# Patient Record
Sex: Female | Born: 1990 | Race: Black or African American | Hispanic: No | Marital: Single | Smoking: Never smoker
Health system: Southern US, Community
[De-identification: ages and names within clinical notes are randomized; demographics above are authoritative.]

## PROBLEM LIST (undated history)

## (undated) DIAGNOSIS — K219 Gastro-esophageal reflux disease without esophagitis: Secondary | ICD-10-CM

## (undated) DIAGNOSIS — H9313 Tinnitus, bilateral: Secondary | ICD-10-CM

## (undated) DIAGNOSIS — H53149 Visual discomfort, unspecified: Secondary | ICD-10-CM

## (undated) DIAGNOSIS — D649 Anemia, unspecified: Secondary | ICD-10-CM

## (undated) DIAGNOSIS — Z9889 Other specified postprocedural states: Secondary | ICD-10-CM

## (undated) DIAGNOSIS — D219 Benign neoplasm of connective and other soft tissue, unspecified: Secondary | ICD-10-CM

## (undated) DIAGNOSIS — T8579XA Infection and inflammatory reaction due to other internal prosthetic devices, implants and grafts, initial encounter: Secondary | ICD-10-CM

## (undated) DIAGNOSIS — R112 Nausea with vomiting, unspecified: Secondary | ICD-10-CM

## (undated) DIAGNOSIS — N83201 Unspecified ovarian cyst, right side: Secondary | ICD-10-CM

## (undated) DIAGNOSIS — J45909 Unspecified asthma, uncomplicated: Secondary | ICD-10-CM

## (undated) DIAGNOSIS — G43909 Migraine, unspecified, not intractable, without status migrainosus: Secondary | ICD-10-CM

## (undated) DIAGNOSIS — R519 Headache, unspecified: Secondary | ICD-10-CM

## (undated) HISTORY — PX: COLONOSCOPY: SHX174

## (undated) HISTORY — PX: INSERTION OF MESH: SHX5868

## (undated) HISTORY — PX: HERNIA REPAIR: SHX51

---

## 2016-04-03 HISTORY — PX: INSERTION OF MESH: SHX5868

## 2016-04-03 HISTORY — PX: HERNIA REPAIR: SHX51

## 2019-04-14 DIAGNOSIS — G894 Chronic pain syndrome: Secondary | ICD-10-CM | POA: Insufficient documentation

## 2019-04-14 DIAGNOSIS — M79604 Pain in right leg: Secondary | ICD-10-CM | POA: Insufficient documentation

## 2019-04-14 DIAGNOSIS — M25531 Pain in right wrist: Secondary | ICD-10-CM | POA: Insufficient documentation

## 2019-04-14 DIAGNOSIS — G8929 Other chronic pain: Secondary | ICD-10-CM | POA: Insufficient documentation

## 2019-07-10 ENCOUNTER — Other Ambulatory Visit: Payer: Self-pay | Admitting: Physician Assistant

## 2019-07-10 DIAGNOSIS — N644 Mastodynia: Secondary | ICD-10-CM

## 2019-07-10 DIAGNOSIS — R600 Localized edema: Secondary | ICD-10-CM | POA: Insufficient documentation

## 2019-07-10 DIAGNOSIS — E66813 Obesity, class 3: Secondary | ICD-10-CM | POA: Insufficient documentation

## 2019-07-10 DIAGNOSIS — K219 Gastro-esophageal reflux disease without esophagitis: Secondary | ICD-10-CM | POA: Insufficient documentation

## 2019-07-10 DIAGNOSIS — N649 Disorder of breast, unspecified: Secondary | ICD-10-CM

## 2019-07-10 DIAGNOSIS — J4520 Mild intermittent asthma, uncomplicated: Secondary | ICD-10-CM | POA: Insufficient documentation

## 2019-07-15 ENCOUNTER — Other Ambulatory Visit: Payer: Self-pay | Admitting: Physician Assistant

## 2019-07-15 DIAGNOSIS — N649 Disorder of breast, unspecified: Secondary | ICD-10-CM

## 2019-07-15 DIAGNOSIS — N644 Mastodynia: Secondary | ICD-10-CM

## 2019-07-16 ENCOUNTER — Emergency Department
Admission: EM | Admit: 2019-07-16 | Discharge: 2019-07-16 | Disposition: A | Discharge status: Home / Self Care | Payer: BC Managed Care – PPO | Attending: Emergency Medicine | Admitting: Emergency Medicine

## 2019-07-16 ENCOUNTER — Other Ambulatory Visit: Payer: Self-pay

## 2019-07-16 ENCOUNTER — Emergency Department: Payer: BC Managed Care – PPO

## 2019-07-16 ENCOUNTER — Encounter: Payer: Self-pay | Admitting: Emergency Medicine

## 2019-07-16 DIAGNOSIS — J45909 Unspecified asthma, uncomplicated: Secondary | ICD-10-CM | POA: Insufficient documentation

## 2019-07-16 DIAGNOSIS — K529 Noninfective gastroenteritis and colitis, unspecified: Secondary | ICD-10-CM | POA: Insufficient documentation

## 2019-07-16 DIAGNOSIS — R109 Unspecified abdominal pain: Secondary | ICD-10-CM | POA: Diagnosis present

## 2019-07-16 HISTORY — DX: Unspecified asthma, uncomplicated: J45.909

## 2019-07-16 HISTORY — DX: Benign neoplasm of connective and other soft tissue, unspecified: D21.9

## 2019-07-16 HISTORY — DX: Infection and inflammatory reaction due to other internal prosthetic devices, implants and grafts, initial encounter: T85.79XA

## 2019-07-16 LAB — URINALYSIS, COMPLETE (UACMP) WITH MICROSCOPIC
Bacteria, UA: NONE SEEN
Bilirubin Urine: NEGATIVE
Glucose, UA: NEGATIVE mg/dL
Ketones, ur: NEGATIVE mg/dL
Nitrite: NEGATIVE
Protein, ur: 30 mg/dL — AB
Specific Gravity, Urine: 1.014 (ref 1.005–1.030)
WBC, UA: >50 WBC/hpf — ABNORMAL HIGH (ref 0–5)
pH: 7 (ref 5.0–8.0)

## 2019-07-16 LAB — CBC
HCT: 40.7 % (ref 36.0–46.0)
Hemoglobin: 13.8 g/dL (ref 12.0–15.0)
MCH: 29.2 pg (ref 26.0–34.0)
MCHC: 33.9 g/dL (ref 30.0–36.0)
MCV: 86 fL (ref 80.0–100.0)
Platelets: 268 10*3/uL (ref 150–400)
RBC: 4.73 MIL/uL (ref 3.87–5.11)
RDW: 13.1 % (ref 11.5–15.5)
WBC: 5.6 10*3/uL (ref 4.0–10.5)
nRBC: 0 % (ref 0.0–0.2)

## 2019-07-16 LAB — COMPREHENSIVE METABOLIC PANEL
ALT: 23 U/L (ref 0–44)
AST: 19 U/L (ref 15–41)
Albumin: 4.6 g/dL (ref 3.5–5.0)
Alkaline Phosphatase: 61 U/L (ref 38–126)
Anion gap: 9 (ref 5–15)
BUN: 12 mg/dL (ref 6–20)
CO2: 27 mmol/L (ref 22–32)
Calcium: 9 mg/dL (ref 8.9–10.3)
Chloride: 101 mmol/L (ref 98–111)
Creatinine, Ser: 0.85 mg/dL (ref 0.44–1.00)
GFR calc Af Amer: >60 mL/min (ref >60)
GFR calc non Af Amer: >60 mL/min (ref >60)
Glucose, Bld: 105 mg/dL — ABNORMAL HIGH (ref 70–99)
Potassium: 3.9 mmol/L (ref 3.5–5.1)
Sodium: 137 mmol/L (ref 135–145)
Total Bilirubin: 0.7 mg/dL (ref 0.3–1.2)
Total Protein: 7.8 g/dL (ref 6.5–8.1)

## 2019-07-16 LAB — POCT PREGNANCY, URINE: Preg Test, Ur: NEGATIVE

## 2019-07-16 LAB — LIPASE, BLOOD: Lipase: 34 U/L (ref 11–51)

## 2019-07-16 MED ORDER — METRONIDAZOLE 500 MG PO TABS: 500.0000 mg | ORAL_TABLET | Freq: Three times a day (TID) | ORAL | 0 refills | Status: AC

## 2019-07-16 MED ORDER — IOHEXOL 300 MG/ML  SOLN
100.0000 mL | Freq: Once | INTRAMUSCULAR | Status: AC | PRN
  Administered 2019-07-16: 100 mL via INTRAVENOUS
  Filled 2019-07-16: qty 100

## 2019-07-16 MED ORDER — AZITHROMYCIN 500 MG PO TABS: 500.0000 mg | ORAL_TABLET | Freq: Every day | ORAL | 0 refills | Status: AC

## 2019-07-16 NOTE — Discharge Instructions (Signed)
Take azithromycin daily for 3 days. Take Flagyl 3 times a day for the next week.

## 2019-07-16 NOTE — ED Provider Notes (Signed)
Emergency Department Provider Note  ____________________________________________  Time seen: Approximately 7:11 PM  I have reviewed the triage vital signs and the nursing notes.   HISTORY  Chief Complaint Abdominal Pain   Historian Patient     HPI Kristina Lane is a 29 y.o. female with a history of prior hernia repairs, presents to the emergency department with sharp and stabbing generalized abdominal pain and perceived abdominal distension.  Patient states that she has had nausea, vomiting and diarrhea intermittently over the past month and has a CT abdomen scheduled with general surgery.  Patient is concerned for compromise of her hernia repair.  She has not noticed any erythema of the skin overlying abdomen.  No dysuria, hematuria or increased urinary frequency.  She does state that she recently ended her menstrual cycle.   Past Medical History:  Diagnosis Date  . Asthma   . Fibroids   . 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     Past Medical History:  Diagnosis Date  . Asthma   . Fibroids   . 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   Past Surgical History:  Procedure Laterality Date  . INSERTION OF MESH      Prior to Admission medications   Medication Sig Start Date End Date Taking? Authorizing Provider  azithromycin (ZITHROMAX) 500 MG tablet Take 1 tablet (500 mg total) by mouth daily for 3 days. 07/16/19 07/19/19  Lannie Fields, PA-C  metroNIDAZOLE (FLAGYL) 500 MG tablet Take 1 tablet (500 mg total) by mouth 3 (three) times daily for 7 days. 07/16/19 07/23/19  Lannie Fields, PA-C    Allergies Ciprofloxacin and Penicillins  No family history on file.  Social History Social History   Tobacco Use  . Smoking status: Not on file  Substance Use Topics  . Alcohol use: Not on file  . Drug use: Not on file     Review of Systems   Constitutional: No fever/chills Eyes:  No discharge ENT: No upper respiratory complaints. Respiratory: no cough. No SOB/ use of accessory muscles to breath Gastrointestinal: Patient has abdominal pain.  Musculoskeletal: Negative for musculoskeletal pain. Skin: Negative for rash, abrasions, lacerations, ecchymosis.    ____________________________________________   PHYSICAL EXAM:  VITAL SIGNS: ED Triage Vitals  Enc Vitals Group     BP 07/16/19 1701 (!) 188/123     Pulse Rate 07/16/19 1701 98     Resp 07/16/19 1701 20     Temp 07/16/19 1701 99 F (37.2 C)     Temp Source 07/16/19 1701 Oral     SpO2 07/16/19 1701 99 %     Weight 07/16/19 1658 222 lb (100.7 kg)     Height 07/16/19 1658 5' (1.524 m)     Head Circumference --      Peak Flow --      Pain Score 07/16/19 1658 9     Pain Loc --      Pain Edu? --      Excl. in [place]? --      Constitutional: Alert and oriented. Well appearing and in no acute distress. Eyes: Conjunctivae are normal. PERRL. EOMI. Head: Atraumatic. ENT:      Ears: TMs are pearly.      Nose: No congestion/rhinnorhea.      Mouth/Throat: Mucous membranes are moist.  Neck: No stridor.  No cervical spine  tenderness to palpation. Hematological/Lymphatic/Immunilogical: No cervical lymphadenopathy. Cardiovascular: Normal rate, regular rhythm. Normal S1 and S2.  Good peripheral circulation. Respiratory: Normal respiratory effort without tachypnea or retractions. Lungs CTAB. Good air entry to the bases with no decreased or absent breath sounds Gastrointestinal: Bowel sounds x 4 quadrants. Soft and nontender to palpation. No guarding or rigidity. No distention. Musculoskeletal: Full range of motion to all extremities. No obvious deformities noted Neurologic:  Normal for age. No gross focal neurologic deficits are appreciated.  Skin:  Skin is warm, dry and intact. No rash noted. Psychiatric: Mood and affect are normal for age. Speech and behavior are normal.    ____________________________________________   LABS (all labs ordered are listed, but only abnormal results are displayed)  Labs Reviewed  COMPREHENSIVE METABOLIC PANEL - Abnormal; Notable for the following components:      Result Value   Glucose, Bld 105 (*)    All other components within normal limits  URINALYSIS, COMPLETE (UACMP) WITH MICROSCOPIC - Abnormal; Notable for the following components:   Color, Urine YELLOW (*)    APPearance CLOUDY (*)    Hgb urine dipstick LARGE (*)    Protein, ur 30 (*)    Leukocytes,Ua MODERATE (*)    WBC, UA >50 (*)    All other components within normal limits  LIPASE, BLOOD  CBC  POCT PREGNANCY, URINE   ____________________________________________  EKG   ____________________________________________  RADIOLOGYI, Lannie Fields, personally viewed and evaluated these images (plain radiographs) as part of my medical decision making, as well as reviewing the written report by the radiologist.    CT ABDOMEN PELVIS W CONTRAST  Result Date: 07/16/2019 CLINICAL DATA:  Abdominal distension, severe lower abdominal pain. History of uterine fibroids and lower abdominal mesh hernia repair EXAM: CT ABDOMEN AND PELVIS WITH CONTRAST TECHNIQUE: Multidetector CT imaging of the abdomen and pelvis was performed using the standard protocol following bolus administration of intravenous contrast. CONTRAST:  120mL OMNIPAQUE IOHEXOL 300 MG/ML  SOLN COMPARISON:  None FINDINGS: Lower chest: Lung bases are clear. Normal heart size. No pericardial effusion. Hepatobiliary: No focal liver abnormality is seen. No gallstones, gallbladder wall thickening, or biliary dilatation. Pancreas: Unremarkable. No pancreatic ductal dilatation or surrounding inflammatory changes. Spleen: Normal in size without focal abnormality. Adrenals/Urinary Tract: Adrenal glands are unremarkable. Kidneys are normal, without renal calculi, focal lesion, or hydronephrosis. Urinary bladder is largely  decompressed at the time of exam and therefore poorly evaluated by CT imaging. Stomach/Bowel: Moderate hiatal hernia. Distal stomach and duodenum are unremarkable. No small bowel dilatation or wall thickening. A normal appendix is visualized. There is some mild edematous mural thickening of the descending colon and proximal sigmoid. No extraluminal gas or fluid. No evidence of bowel obstruction. Vascular/Lymphatic: The aorta is normal caliber. No suspicious or enlarged lymph nodes in the included lymphatic chains. Reproductive: Anteverted uterus with a lobular contour along the dorsal aspect likely reflecting the reportedly known uterine fibroid. No concerning adnexal lesions. Other: No abdominopelvic free fluid or air. No bowel containing hernias. Evidence of prior low vertical midline incision with anterior mesh hernia repair without visible acute complication at this time. Musculoskeletal: Some mild atrophy of the rectus sheath about the level of the incisional changes, may be postsurgical in nature. No acute osseous abnormality or suspicious osseous lesion. IMPRESSION: 1. Mild edematous mural thickening of the descending colon and proximal sigmoid colon may represent a mild colitis of infectious or inflammatory etiology. 2. Moderate hiatal hernia. 3. Evidence of prior low vertical midline  incision with anterior mesh hernia repair without visible acute complication at this time. 4. Fibroid uterus. Electronically Signed   By: Lovena Le M.D.   On: 07/16/2019 19:50    ____________________________________________    PROCEDURES  Procedure(s) performed:     Procedures     Medications  iohexol (OMNIPAQUE) 300 MG/ML solution 100 mL (100 mLs Intravenous Contrast Given 07/16/19 1930)     ____________________________________________   INITIAL IMPRESSION / ASSESSMENT AND PLAN / ED COURSE  Pertinent labs & imaging results that were available during my care of the patient were reviewed by me and  considered in my medical decision making (see chart for details).  Clinical Course as of Jul 15 2236  Wed Jul 16, 2019  1843 Hemoglobin: 13.8 [JW]    Clinical Course User Index [JW] Lannie Fields, PA-C   Assessment and plan Abdominal pain 29 year old female presents to the emergency department with generalized abdominal pain along with nausea, vomiting and mild diarrhea that has occurred over the past month.  Vital signs were reviewed at triage.  Patient was hypertensive but vital signs were otherwise reassuring.  On physical exam, abdomen was soft and nontender.  Differential diagnosis included compromised hernia repair, intra-abdominal abscess, colitis.  CT abdomen and pelvis revealed a moderate sized hiatal hernia but no other compromise in anterior mesh hernia repair.  CT findings did suggest inflammatory or infectious findings of colitis.  We will treat patient with azithromycin given anaphylaxis with penicillins and Cipro.  Patient was also discharged with Flagyl.  She was advised to follow-up with primary care as needed.  A work note was provided at her request.    ____________________________________________  FINAL CLINICAL IMPRESSION(S) / ED DIAGNOSES  Final diagnoses:  Colitis      NEW MEDICATIONS STARTED DURING THIS VISIT:  ED Discharge Orders         Ordered    azithromycin (ZITHROMAX) 500 MG tablet  Daily     07/16/19 2018    metroNIDAZOLE (FLAGYL) 500 MG tablet  3 times daily     07/16/19 2018              This chart was dictated using voice recognition software/Dragon. Despite best efforts to proofread, errors can occur which can change the meaning. Any change was purely unintentional.     Lannie Fields, PA-C 07/16/19 2247    Harvest Dark, MD 07/16/19 2257

## 2019-07-16 NOTE — ED Triage Notes (Signed)
Pt reports severe abd pain to her lower and upper abd. Pt reports has seen her MD for it and has  CT scan scheduled for it because she already has fibroids and a mesh but the pain is to severe so she was advised to come for evaluation. Pt describes the pain as stabbing and cramping and consistent in nature.

## 2019-07-16 NOTE — ED Triage Notes (Signed)
FIRST NURSE NOTE- ambulatory NAD

## 2019-07-24 ENCOUNTER — Ambulatory Visit
Admission: RE | Admit: 2019-07-24 | Discharge: 2019-07-24 | Disposition: A | Discharge status: Home / Self Care | Payer: BC Managed Care – PPO | Source: Ambulatory Visit | Attending: Physician Assistant | Admitting: Physician Assistant

## 2019-07-24 DIAGNOSIS — N649 Disorder of breast, unspecified: Secondary | ICD-10-CM | POA: Diagnosis present

## 2019-07-24 DIAGNOSIS — N644 Mastodynia: Secondary | ICD-10-CM

## 2019-08-15 ENCOUNTER — Other Ambulatory Visit: Payer: Self-pay | Admitting: Obstetrics and Gynecology

## 2019-09-09 NOTE — H&P (Signed)
Kristina Lane is a 29 y.o. female here for Rf Vicente Males Mackie,CNM-uterine fibroids and pelvic pain .pt sent to me by Drinda Butts for a 5 yr h/o of pelvic pain usually starting 3 days before cycle and into 1-2 days of menses . G2P2 one c/s followed by SVD  . Last 5 months with deep thrust dyspareunia . Same partner .   Menses are monthly and are not heavy  U/s recent :  Uterus anteverted  Endometrium=14.64mm  Fibroids seen:1)Lt lateral=1.7cm 2)mid=1.4cm 3)posterior=4.5cm  No free fluid seen  Lt ovary appears wnl  Rt simple ovarian cyst=1.8cm     Past Medical History:  has a past medical history of Asthma, unspecified asthma severity, unspecified whether complicated, unspecified whether persistent, GERD (gastroesophageal reflux disease) (07/10/2019), Localized edema (07/10/2019), Obesity, and Obesity, Class III, BMI 40-49.9 (morbid obesity) (CMS-HCC) (07/10/2019).  Past Surgical History:  has a past surgical history that includes Hernia repair and Cesarean section (2014). Family History: family history includes Arthritis in her paternal grandmother; Breast cancer in her maternal grandmother and paternal aunt; Kidney disease in her father. Social History:  reports that she has never smoked. She has never used smokeless tobacco. She reports that she does not drink alcohol and does not use drugs. OB/GYN History:          OB History    Gravida  2   Para  2   Term  1   Preterm  1   AB      Living  2     SAB      TAB      Ectopic      Molar      Multiple      Live Births  2          Allergies: is allergic to ciprofloxacin and penicillin. PCN allergy ( throat swelling ) Medications:  Current Outpatient Medications:  .  acetaminophen (TYLENOL) 500 mg capsule, Take by mouth, Disp: , Rfl:  .  butalbital-acetaminophen-caffeine (FIORICET) 50-325-40 mg capsule, Take 1-2 capsules by mouth every 6 (six) hours as needed for Headache Patient will take 2 tablets at the  onset of headache, then she may take 1 tablet q 6 hours , not to exceed 6 tablets in a 24 hour period, Disp: 30 capsule, Rfl: 0 .  ergocalciferol, vitamin D2, 1,250 mcg (50,000 unit) capsule, Take by mouth, Disp: , Rfl:  .  FUROsemide (LASIX) 20 MG tablet, Take 1 tablet (20 mg total) by mouth once daily as needed for Edema, Disp: 30 tablet, Rfl: 0 .  potassium chloride (KLOR-CON) 10 MEQ ER tablet, Take 1 tablet (10 mEq total) by mouth once daily (with each Lasix pill), Disp: 30 tablet, Rfl: 0 .  azithromycin (ZITHROMAX) 500 MG tablet, Take 500 mg by mouth once daily (Patient not taking: Reported on 08/07/2019  ), Disp: , Rfl:  .  FUROsemide (LASIX) 20 MG tablet, Take 1 tablet (20 mg total) by mouth once daily (Patient not taking: Reported on 08/07/2019  ), Disp: 30 tablet, Rfl: 0 .  potassium chloride (KLOR-CON) 10 MEQ ER tablet, Take 1 tablet (10 mEq total) by mouth once daily (take at the same time as Lasix) (Patient not taking: Reported on 08/07/2019  ), Disp: 30 tablet, Rfl: 0  Review of Systems: General:                      No fatigue or weight loss Eyes:  No vision changes Ears:                            No hearing difficulty Respiratory:                No cough or shortness of breath Pulmonary:                  No asthma or shortness of breath Cardiovascular:           No chest pain, palpitations, dyspnea on exertion Gastrointestinal:          No abdominal bloating, chronic diarrhea, constipations, masses, pain or hematochezia Genitourinary:             No hematuria, dysuria, abnormal vaginal discharge, +pelvic pain, Menometrorrhagia, + dyspareunia Lymphatic:                   No swollen lymph nodes Musculoskeletal:         No muscle weakness Neurologic:                  No extremity weakness, syncope, seizure disorder Psychiatric:                  No history of depression, delusions or suicidal/homicidal ideation    Exam:      Vitals:   09/10/2019 1607   BP: (!) 138/98  Pulse:        Body mass index is 42.51 kg/m.  WDWN  black female in NAD   Lungs: CTA  CV : RRR without murmur   Neck:  no thyromegaly Abdomen: soft , no mass, normal active bowel sounds,  non-tender, no rebound tenderness Pelvic: tanner stage 5 ,  External genitalia: vulva /labia no lesions Urethra: no prolapse Vagina: bloody d/c  Adequate for LAVH if need be  Cervix: no lesions, no cervical motion tenderness   Uterus: normal size shape and contour, non-tender Adnexa: no mass,  non-tender   Rectovaginal: no mass heme negative  Impression:   The primary encounter diagnosis was Pelvic pain in female. Diagnoses of Dyspareunia, female and Intramural leiomyoma of uterus were also pertinent to this visit. History of pain is c/w endometriosis   Plan:   Long discussion regarding options for treating pain :  continuous OCPS( she didn't tolerate ocp at age 30-12 yo) Empiric tx with depolupron orOrlissa  Surgical intervention with LAVH and bilateral salpingectomy  Pro and cons discusses for each . She has elected to  Proceed with surgical intervention       Caroline Sauger, MD

## 2019-09-10 ENCOUNTER — Other Ambulatory Visit: Payer: Self-pay | Admitting: Obstetrics and Gynecology

## 2019-09-11 ENCOUNTER — Encounter
Admission: RE | Admit: 2019-09-11 | Discharge: 2019-09-11 | Disposition: A | Discharge status: Home / Self Care | Payer: BC Managed Care – PPO | Source: Ambulatory Visit | Attending: Obstetrics and Gynecology | Admitting: Obstetrics and Gynecology

## 2019-09-11 ENCOUNTER — Other Ambulatory Visit: Payer: Self-pay

## 2019-09-11 HISTORY — DX: Gastro-esophageal reflux disease without esophagitis: K21.9

## 2019-09-11 HISTORY — DX: Headache, unspecified: R51.9

## 2019-09-11 NOTE — Patient Instructions (Signed)
COVID TESTING Date: September 18, 2019 Testing site:  [facility] Hours:  3:53 am - 1:00 pm Once you are tested, you are asked to stay quarantined (avoiding public places) until after your surgery.   Your procedure is scheduled on: September 22, 2019 MONDAY Report to Day Surgery on the 2nd floor of the [facility]. To find out your arrival time, please call [phone] between 1PM - 3PM on: Friday September 19, 2019  REMEMBER: Instructions that are not followed completely may result in serious medical risk, up to and including death; or upon the discretion of your surgeon and anesthesiologist your surgery may need to be rescheduled.  Do not eat food after midnight the night before surgery.  No gum chewing, lozengers or hard candies.  You may however, drink CLEAR liquids up to 2 hours before you are scheduled to arrive for your surgery. Do not drink anything within 2 hours of your scheduled arrival time.  Clear liquids include: - water  - apple juice without pulp - gatorade (not RED) - black coffee or tea (Do NOT add milk or creamers to the coffee or tea) Do NOT drink anything that is not on this list.  Type 1 and Type 2 diabetics should only drink water.  ENSURE PRE-SURGERY CARBOHYDRATE DRINK:  Complete drinking 2 hours prior to scheduled arrival time.  TAKE THESE MEDICATIONS THE MORNING OF SURGERY WITH A SIP OF WATER: NONE  Stop Anti-inflammatories (NSAIDS) such as Advil, Aleve, Ibuprofen, Motrin, Naproxen, Naprosyn and ASPIRIN OR Aspirin based products such as Excedrin, Goodys Powder, BC Powder. (May take Tylenol or Acetaminophen if needed.)  Stop ANY OVER THE COUNTER supplements until after surgery. (May continue Vitamin D, Vitamin B, and multivitamin.)  No Alcohol for 24 hours before or after surgery.  No Smoking including e-cigarettes for 24 hours prior to surgery.  No chewable tobacco products for at least 6 hours prior to surgery.   No nicotine patches on the day of surgery.  Do not use any "recreational" drugs for at least a week prior to your surgery.  Please be advised that the combination of cocaine and anesthesia may have negative outcomes, up to and including death. If you test positive for cocaine, your surgery will be cancelled.  On the morning of surgery brush your teeth with toothpaste and water, you may rinse your mouth with mouthwash if you wish. Do not swallow any toothpaste or mouthwash.  Do not wear jewelry, make-up, hairpins, clips or nail polish.  Do not wear lotions, powders, or perfumes.   Do not shave 48 hours prior to surgery.   Contact lenses, hearing aids and dentures may not be worn into surgery.  Do not bring valuables to the hospital. [facility] is not responsible for any missing/lost belongings or valuables.   Use CHG Soap as directed on instruction sheet.  Notify your doctor if there is any change in your medical condition (cold, fever, infection).  Wear comfortable clothing (specific to your surgery type) to the hospital.  Plan for stool softeners for home use; pain medications have a tendency to cause constipation. You can also help prevent constipation by eating foods high in fiber such as fruits and vegetables and drinking plenty of fluids as your diet allows.  After surgery, you can help prevent lung complications by doing breathing exercises.  Take deep breaths and cough every 1-2 hours. Your doctor may order a device called an Incentive Spirometer to help you  take deep breaths. When coughing or sneezing, hold a pillow firmly against your incision with both hands. This is called "splinting." Doing this helps protect your incision. It also decreases belly discomfort.  If you are being admitted to the hospital overnight, leave your suitcase in the car. After surgery it may be brought to your room.  If you are being discharged the day of surgery, you will not be allowed to  drive home. You will need a responsible adult (18 years or older) to drive you home and stay with you that night.   If you are taking public transportation, you will need to have a responsible adult (18 years or older) with you. Please confirm with your physician that it is acceptable to use public transportation.   Please call the [facility] at [phone] if you have any questions about these instructions.  Visitation Policy:  Patients undergoing a surgery or procedure may have one family member or support person with them as long as that person is not COVID-19 positive or experiencing its symptoms.  That person may remain in the waiting area during the procedure.  Children under 22 years of age may have both parents or legal guardians with them during their procedure.  Inpatient Visitation Update:   Two designated support people may visit a patient during visiting hours 7 am to 8 pm. It must be the same two designated people for the duration of the patient stay. The visitors may come and go during the day, and there is no switching out to have different visitors. A mask must be worn at all times, including in the patient room.  Children under 86 years of age:  a total of 4 designated visitors for the child's entire stay are allowed. Only 2 in the room at a time and only one staying overnight at a time. The overnight guest can now rotate during the child's hospital stay.  As a reminder, masks are still required for all [place] team members, patients and visitors in all [facility] facilities.   Systemwide, no visitors 17 or younger.

## 2019-09-18 ENCOUNTER — Other Ambulatory Visit
Admission: RE | Admit: 2019-09-18 | Discharge: 2019-09-18 | Disposition: A | Discharge status: Home / Self Care | Payer: BC Managed Care – PPO | Source: Ambulatory Visit | Attending: Obstetrics and Gynecology | Admitting: Obstetrics and Gynecology

## 2019-09-18 ENCOUNTER — Other Ambulatory Visit: Payer: Self-pay

## 2019-09-18 DIAGNOSIS — Z20822 Contact with and (suspected) exposure to covid-19: Secondary | ICD-10-CM | POA: Diagnosis not present

## 2019-09-18 DIAGNOSIS — Z01812 Encounter for preprocedural laboratory examination: Secondary | ICD-10-CM | POA: Insufficient documentation

## 2019-09-18 LAB — SARS CORONAVIRUS 2 (TAT 6-24 HRS): SARS Coronavirus 2: NEGATIVE

## 2019-09-18 LAB — BASIC METABOLIC PANEL
Anion gap: 8 (ref 5–15)
BUN: 14 mg/dL (ref 6–20)
CO2: 24 mmol/L (ref 22–32)
Calcium: 8.7 mg/dL — ABNORMAL LOW (ref 8.9–10.3)
Chloride: 106 mmol/L (ref 98–111)
Creatinine, Ser: 0.74 mg/dL (ref 0.44–1.00)
GFR calc Af Amer: >60 mL/min (ref >60)
GFR calc non Af Amer: >60 mL/min (ref >60)
Glucose, Bld: 93 mg/dL (ref 70–99)
Potassium: 3.6 mmol/L (ref 3.5–5.1)
Sodium: 138 mmol/L (ref 135–145)

## 2019-09-18 LAB — CBC
HCT: 37.7 % (ref 36.0–46.0)
Hemoglobin: 13.1 g/dL (ref 12.0–15.0)
MCH: 29 pg (ref 26.0–34.0)
MCHC: 34.7 g/dL (ref 30.0–36.0)
MCV: 83.6 fL (ref 80.0–100.0)
Platelets: 249 10*3/uL (ref 150–400)
RBC: 4.51 MIL/uL (ref 3.87–5.11)
RDW: 13.5 % (ref 11.5–15.5)
WBC: 5.2 10*3/uL (ref 4.0–10.5)
nRBC: 0 % (ref 0.0–0.2)

## 2019-09-18 LAB — TYPE AND SCREEN
ABO/RH(D): A POS
Antibody Screen: NEGATIVE

## 2019-09-22 ENCOUNTER — Ambulatory Visit
Admission: RE | Admit: 2019-09-22 | Discharge: 2019-09-22 | Disposition: A | Discharge status: Home / Self Care | Payer: BC Managed Care – PPO | Attending: Obstetrics and Gynecology | Admitting: Obstetrics and Gynecology

## 2019-09-22 ENCOUNTER — Encounter: Payer: Self-pay | Admitting: Obstetrics and Gynecology

## 2019-09-22 ENCOUNTER — Ambulatory Visit: Payer: BC Managed Care – PPO | Admitting: Certified Registered Nurse Anesthetist

## 2019-09-22 ENCOUNTER — Encounter
Admission: RE | Disposition: A | Discharge status: Home / Self Care | Payer: Self-pay | Source: Home / Self Care | Attending: Obstetrics and Gynecology

## 2019-09-22 DIAGNOSIS — K219 Gastro-esophageal reflux disease without esophagitis: Secondary | ICD-10-CM | POA: Insufficient documentation

## 2019-09-22 DIAGNOSIS — Z880 Allergy status to penicillin: Secondary | ICD-10-CM | POA: Insufficient documentation

## 2019-09-22 DIAGNOSIS — N838 Other noninflammatory disorders of ovary, fallopian tube and broad ligament: Secondary | ICD-10-CM | POA: Insufficient documentation

## 2019-09-22 DIAGNOSIS — D251 Intramural leiomyoma of uterus: Secondary | ICD-10-CM | POA: Insufficient documentation

## 2019-09-22 DIAGNOSIS — R102 Pelvic and perineal pain: Secondary | ICD-10-CM | POA: Diagnosis not present

## 2019-09-22 DIAGNOSIS — N941 Unspecified dyspareunia: Secondary | ICD-10-CM | POA: Insufficient documentation

## 2019-09-22 DIAGNOSIS — J45909 Unspecified asthma, uncomplicated: Secondary | ICD-10-CM | POA: Insufficient documentation

## 2019-09-22 DIAGNOSIS — E669 Obesity, unspecified: Secondary | ICD-10-CM | POA: Insufficient documentation

## 2019-09-22 DIAGNOSIS — G43909 Migraine, unspecified, not intractable, without status migrainosus: Secondary | ICD-10-CM | POA: Diagnosis not present

## 2019-09-22 DIAGNOSIS — Z79899 Other long term (current) drug therapy: Secondary | ICD-10-CM | POA: Diagnosis not present

## 2019-09-22 DIAGNOSIS — Z6841 Body Mass Index (BMI) 40.0 and over, adult: Secondary | ICD-10-CM | POA: Diagnosis not present

## 2019-09-22 DIAGNOSIS — Z881 Allergy status to other antibiotic agents status: Secondary | ICD-10-CM | POA: Insufficient documentation

## 2019-09-22 HISTORY — PX: LAPAROSCOPIC VAGINAL HYSTERECTOMY WITH SALPINGECTOMY: SHX6680

## 2019-09-22 LAB — ABO/RH: ABO/RH(D): A POS

## 2019-09-22 LAB — POCT PREGNANCY, URINE: Preg Test, Ur: NEGATIVE

## 2019-09-22 SURGERY — HYSTERECTOMY, VAGINAL, LAPAROSCOPY-ASSISTED, WITH SALPINGECTOMY
Anesthesia: General | Laterality: Bilateral

## 2019-09-22 MED ORDER — OXYCODONE HCL 5 MG PO TABS
ORAL_TABLET | ORAL | Status: AC
  Filled 2019-09-22: qty 1

## 2019-09-22 MED ORDER — METOPROLOL TARTRATE 5 MG/5ML IV SOLN
INTRAVENOUS | Status: DC | PRN
  Administered 2019-09-22 (×2): 1 mg via INTRAVENOUS

## 2019-09-22 MED ORDER — PHENYLEPHRINE HCL (PRESSORS) 10 MG/ML IV SOLN
INTRAVENOUS | Status: AC
  Filled 2019-09-22: qty 1

## 2019-09-22 MED ORDER — KETOROLAC TROMETHAMINE 30 MG/ML IJ SOLN
INTRAMUSCULAR | Status: AC
  Filled 2019-09-22: qty 1

## 2019-09-22 MED ORDER — GABAPENTIN 300 MG PO CAPS
300.0000 mg | ORAL_CAPSULE | ORAL | Status: AC
  Administered 2019-09-22: 300 mg via ORAL

## 2019-09-22 MED ORDER — FENTANYL CITRATE (PF) 100 MCG/2ML IJ SOLN
INTRAMUSCULAR | Status: AC
  Filled 2019-09-22: qty 2

## 2019-09-22 MED ORDER — DEXMEDETOMIDINE HCL 200 MCG/2ML IV SOLN
INTRAVENOUS | Status: DC | PRN
  Administered 2019-09-22 (×2): 4 ug via INTRAVENOUS
  Administered 2019-09-22: 8 ug via INTRAVENOUS
  Administered 2019-09-22: 4 ug via INTRAVENOUS

## 2019-09-22 MED ORDER — FAMOTIDINE 20 MG PO TABS
ORAL_TABLET | ORAL | Status: AC
  Filled 2019-09-22: qty 1

## 2019-09-22 MED ORDER — BUPIVACAINE HCL 0.5 % IJ SOLN
INTRAMUSCULAR | Status: DC | PRN
  Administered 2019-09-22: 10 mL

## 2019-09-22 MED ORDER — PROPOFOL 10 MG/ML IV BOLUS
INTRAVENOUS | Status: DC | PRN
  Administered 2019-09-22: 170 mg via INTRAVENOUS

## 2019-09-22 MED ORDER — FENTANYL CITRATE (PF) 100 MCG/2ML IJ SOLN
25.0000 ug | INTRAMUSCULAR | Status: DC | PRN
  Administered 2019-09-22 (×2): 25 ug via INTRAVENOUS

## 2019-09-22 MED ORDER — EPHEDRINE 5 MG/ML INJ
INTRAVENOUS | Status: AC
  Filled 2019-09-22: qty 10

## 2019-09-22 MED ORDER — MIDAZOLAM HCL 2 MG/2ML IJ SOLN
INTRAMUSCULAR | Status: AC
  Filled 2019-09-22: qty 2

## 2019-09-22 MED ORDER — MIDAZOLAM HCL 2 MG/2ML IJ SOLN
INTRAMUSCULAR | Status: DC | PRN
  Administered 2019-09-22: 4 mg via INTRAVENOUS

## 2019-09-22 MED ORDER — ACETAMINOPHEN 500 MG PO TABS
ORAL_TABLET | ORAL | Status: AC
  Filled 2019-09-22: qty 2

## 2019-09-22 MED ORDER — FENTANYL CITRATE (PF) 100 MCG/2ML IJ SOLN
INTRAMUSCULAR | Status: AC
  Administered 2019-09-22: 25 ug via INTRAVENOUS
  Filled 2019-09-22: qty 2

## 2019-09-22 MED ORDER — ROCURONIUM BROMIDE 100 MG/10ML IV SOLN
INTRAVENOUS | Status: DC | PRN
  Administered 2019-09-22: 50 mg via INTRAVENOUS

## 2019-09-22 MED ORDER — DEXMEDETOMIDINE HCL IN NACL 80 MCG/20ML IV SOLN
INTRAVENOUS | Status: AC
  Filled 2019-09-22: qty 20

## 2019-09-22 MED ORDER — PHENYLEPHRINE HCL (PRESSORS) 10 MG/ML IV SOLN
INTRAVENOUS | Status: DC | PRN
  Administered 2019-09-22 (×3): 100 ug via INTRAVENOUS
  Administered 2019-09-22: 200 ug via INTRAVENOUS
  Administered 2019-09-22: 100 ug via INTRAVENOUS

## 2019-09-22 MED ORDER — LIDOCAINE HCL (CARDIAC) PF 100 MG/5ML IV SOSY
PREFILLED_SYRINGE | INTRAVENOUS | Status: DC | PRN
  Administered 2019-09-22: 100 mg via INTRAVENOUS

## 2019-09-22 MED ORDER — PROPOFOL 10 MG/ML IV BOLUS
INTRAVENOUS | Status: AC
  Filled 2019-09-22: qty 20

## 2019-09-22 MED ORDER — GENTAMICIN SULFATE 40 MG/ML IJ SOLN
5.0000 mg/kg | Freq: Once | INTRAVENOUS | Status: AC
  Administered 2019-09-22: 336 mg via INTRAVENOUS
  Filled 2019-09-22: qty 8.5

## 2019-09-22 MED ORDER — LIDOCAINE HCL (PF) 2 % IJ SOLN
INTRAMUSCULAR | Status: AC
  Filled 2019-09-22: qty 5

## 2019-09-22 MED ORDER — DEXAMETHASONE SODIUM PHOSPHATE 10 MG/ML IJ SOLN
INTRAMUSCULAR | Status: AC
  Filled 2019-09-22: qty 1

## 2019-09-22 MED ORDER — CHLORHEXIDINE GLUCONATE 0.12 % MT SOLN
OROMUCOSAL | Status: AC
  Filled 2019-09-22: qty 15

## 2019-09-22 MED ORDER — MEPERIDINE HCL 50 MG/ML IJ SOLN: 6.2500 mg | INTRAMUSCULAR | Status: DC | PRN

## 2019-09-22 MED ORDER — CLINDAMYCIN PHOSPHATE 900 MG/50ML IV SOLN
INTRAVENOUS | Status: AC
  Filled 2019-09-22: qty 50

## 2019-09-22 MED ORDER — LIDOCAINE-EPINEPHRINE 1 %-1:100000 IJ SOLN
INTRAMUSCULAR | Status: DC | PRN
  Administered 2019-09-22: 10 mL

## 2019-09-22 MED ORDER — DEXAMETHASONE SODIUM PHOSPHATE 10 MG/ML IJ SOLN
INTRAMUSCULAR | Status: DC | PRN
  Administered 2019-09-22: 10 mg via INTRAVENOUS

## 2019-09-22 MED ORDER — SUGAMMADEX SODIUM 200 MG/2ML IV SOLN
INTRAVENOUS | Status: DC | PRN
  Administered 2019-09-22: 200 mg via INTRAVENOUS

## 2019-09-22 MED ORDER — ONDANSETRON HCL 4 MG/2ML IJ SOLN
INTRAMUSCULAR | Status: DC | PRN
  Administered 2019-09-22: 4 mg via INTRAVENOUS

## 2019-09-22 MED ORDER — OXYCODONE HCL 5 MG PO TABS
5.0000 mg | ORAL_TABLET | Freq: Once | ORAL | Status: AC | PRN
  Administered 2019-09-22: 5 mg via ORAL

## 2019-09-22 MED ORDER — ONDANSETRON HCL 4 MG/2ML IJ SOLN
INTRAMUSCULAR | Status: AC
  Filled 2019-09-22: qty 2

## 2019-09-22 MED ORDER — FAMOTIDINE 20 MG PO TABS
20.0000 mg | ORAL_TABLET | Freq: Once | ORAL | Status: AC
  Administered 2019-09-22: 20 mg via ORAL

## 2019-09-22 MED ORDER — LACTATED RINGERS IV SOLN: INTRAVENOUS | Status: DC

## 2019-09-22 MED ORDER — ACETAMINOPHEN 500 MG PO TABS
1000.0000 mg | ORAL_TABLET | ORAL | Status: AC
  Administered 2019-09-22: 1000 mg via ORAL

## 2019-09-22 MED ORDER — ORAL CARE MOUTH RINSE: 15.0000 mL | Freq: Once | OROMUCOSAL | Status: AC

## 2019-09-22 MED ORDER — ROCURONIUM BROMIDE 10 MG/ML (PF) SYRINGE
PREFILLED_SYRINGE | INTRAVENOUS | Status: AC
  Filled 2019-09-22: qty 10

## 2019-09-22 MED ORDER — KETOROLAC TROMETHAMINE 30 MG/ML IJ SOLN
INTRAMUSCULAR | Status: DC | PRN
  Administered 2019-09-22: 30 mg via INTRAVENOUS

## 2019-09-22 MED ORDER — CHLORHEXIDINE GLUCONATE 0.12 % MT SOLN
15.0000 mL | Freq: Once | OROMUCOSAL | Status: AC
  Administered 2019-09-22: 15 mL via OROMUCOSAL

## 2019-09-22 MED ORDER — FENTANYL CITRATE (PF) 100 MCG/2ML IJ SOLN
INTRAMUSCULAR | Status: DC | PRN
  Administered 2019-09-22 (×3): 50 ug via INTRAVENOUS

## 2019-09-22 MED ORDER — LIDOCAINE-EPINEPHRINE 1 %-1:100000 IJ SOLN
INTRAMUSCULAR | Status: AC
  Filled 2019-09-22: qty 1

## 2019-09-22 MED ORDER — OXYCODONE HCL 5 MG/5ML PO SOLN: 5.0000 mg | Freq: Once | ORAL | Status: AC | PRN

## 2019-09-22 MED ORDER — CLINDAMYCIN PHOSPHATE 900 MG/50ML IV SOLN
900.0000 mg | Freq: Once | INTRAVENOUS | Status: AC
  Administered 2019-09-22: 900 mg via INTRAVENOUS

## 2019-09-22 MED ORDER — BUPIVACAINE HCL (PF) 0.5 % IJ SOLN
INTRAMUSCULAR | Status: AC
  Filled 2019-09-22: qty 30

## 2019-09-22 MED ORDER — EPHEDRINE SULFATE 50 MG/ML IJ SOLN
INTRAMUSCULAR | Status: DC | PRN
  Administered 2019-09-22 (×2): 5 mg via INTRAVENOUS

## 2019-09-22 MED ORDER — GABAPENTIN 300 MG PO CAPS
ORAL_CAPSULE | ORAL | Status: AC
  Filled 2019-09-22: qty 1

## 2019-09-22 MED ORDER — PROMETHAZINE HCL 25 MG/ML IJ SOLN: 6.2500 mg | INTRAMUSCULAR | Status: DC | PRN

## 2019-09-22 SURGICAL SUPPLY — 49 items
BAG URINE DRAIN 2000ML AR STRL (UROLOGICAL SUPPLIES) ×3 IMPLANT
BLADE SURG SZ11 CARB STEEL (BLADE) ×3 IMPLANT
CATH FOLEY 2WAY  5CC 16FR (CATHETERS) ×2
CATH ROBINSON RED A/P 16FR (CATHETERS) IMPLANT
CATH URTH 16FR FL 2W BLN LF (CATHETERS) ×1 IMPLANT
CHLORAPREP W/TINT 26 (MISCELLANEOUS) ×3 IMPLANT
CLOSURE WOUND 1/2 X4 (GAUZE/BANDAGES/DRESSINGS)
COVER WAND RF STERILE (DRAPES) ×3 IMPLANT
DRAPE SURG 17X11 SM STRL (DRAPES) ×3 IMPLANT
DRSG TEGADERM 2-3/8X2-3/4 SM (GAUZE/BANDAGES/DRESSINGS) ×12 IMPLANT
ELECT REM PT RETURN 9FT ADLT (ELECTROSURGICAL) ×3
ELECTRODE REM PT RTRN 9FT ADLT (ELECTROSURGICAL) ×1 IMPLANT
FILTER LAP SMOKE EVAC STRL (MISCELLANEOUS) ×3 IMPLANT
GAUZE 4X4 16PLY RFD (DISPOSABLE) ×3 IMPLANT
GLOVE SURG SYN 8.0 (GLOVE) ×12 IMPLANT
GOWN STRL REUS W/ TWL LRG LVL3 (GOWN DISPOSABLE) ×5 IMPLANT
GOWN STRL REUS W/ TWL XL LVL3 (GOWN DISPOSABLE) ×2 IMPLANT
GOWN STRL REUS W/TWL LRG LVL3 (GOWN DISPOSABLE) ×10
GOWN STRL REUS W/TWL XL LVL3 (GOWN DISPOSABLE) ×4
GRASPER SUT TROCAR 14GX15 (MISCELLANEOUS) ×3 IMPLANT
IRRIGATION STRYKERFLOW (MISCELLANEOUS) IMPLANT
IRRIGATOR STRYKERFLOW (MISCELLANEOUS)
IV LACTATED RINGERS 1000ML (IV SOLUTION) ×3 IMPLANT
KIT PINK PAD W/HEAD ARE REST (MISCELLANEOUS) ×3
KIT PINK PAD W/HEAD ARM REST (MISCELLANEOUS) ×1 IMPLANT
KIT TURNOVER CYSTO (KITS) ×3 IMPLANT
LABEL OR SOLS (LABEL) IMPLANT
NEEDLE HYPO 22GX1.5 SAFETY (NEEDLE) ×3 IMPLANT
PACK BASIN MINOR (MISCELLANEOUS) ×3 IMPLANT
PACK GYN LAPAROSCOPIC (MISCELLANEOUS) ×3 IMPLANT
PAD OB MATERNITY 4.3X12.25 (PERSONAL CARE ITEMS) ×3 IMPLANT
SHEARS HARMONIC ACE PLUS 36CM (ENDOMECHANICALS) ×3 IMPLANT
SLEEVE ENDOPATH XCEL 5M (ENDOMECHANICALS) ×6 IMPLANT
SPONGE GAUZE 2X2 8PLY STER LF (GAUZE/BANDAGES/DRESSINGS) ×3
SPONGE GAUZE 2X2 8PLY STRL LF (GAUZE/BANDAGES/DRESSINGS) ×6 IMPLANT
STRIP CLOSURE SKIN 1/2X4 (GAUZE/BANDAGES/DRESSINGS) IMPLANT
SUT VIC AB 0 CT1 27 (SUTURE) ×4
SUT VIC AB 0 CT1 27XCR 8 STRN (SUTURE) ×2 IMPLANT
SUT VIC AB 0 CT1 36 (SUTURE) ×6 IMPLANT
SUT VIC AB 0 CT2 27 (SUTURE) IMPLANT
SUT VIC AB 2-0 SH 27 (SUTURE) ×2
SUT VIC AB 2-0 SH 27XBRD (SUTURE) ×1 IMPLANT
SUT VIC AB 2-0 UR6 27 (SUTURE) ×3 IMPLANT
SUT VIC AB 4-0 SH 27 (SUTURE) ×2
SUT VIC AB 4-0 SH 27XANBCTRL (SUTURE) ×1 IMPLANT
SYR 10ML LL (SYRINGE) ×3 IMPLANT
SYR CONTROL 10ML LL (SYRINGE) ×3 IMPLANT
TROCAR XCEL NON-BLD 5MMX100MML (ENDOMECHANICALS) ×3 IMPLANT
TUBING EVAC SMOKE HEATED PNEUM (TUBING) ×3 IMPLANT

## 2019-09-22 NOTE — Op Note (Signed)
Kristina Lane, Kristina Lane MEDICAL RECORD [id number] ACCOUNT [id number] DATE OF BIRTH:02-Nov-1990 FACILITY: [facility] LOCATION: [facility] PHYSICIAN:Sudiksha Victor Josefine Class, MD  OPERATIVE REPORT  DATE OF PROCEDURE:  09/22/2019  PREOPERATIVE DIAGNOSES: 1.  Pelvic pain. 2.  Dyspareunia. 3.  Fibroid uterus.  POSTOPERATIVE DIAGNOSES:   1.  Pelvic pain. 2.  Dyspareunia. 3.  Fibroid uterus.  PROCEDURE: 1.  Laparoscopic assisted vaginal hysterectomy. 2.  Bilateral salpingectomy.  ANESTHESIA:  General endotracheal anesthesia.  SURGEON:  Laverta [place], MD  FIRST ASSISTANT:  Benjaman Kindler, MD  INDICATIONS:  A 29 year old gravida 2, para 1, status post cesarean section.  The patient with a history of monthly pelvic pain 3 days before onset of menses.  The patient is known to have a posterior uterine fibroid and complains of dyspareunia.  The  patient opts for definitive surgical intervention.  DESCRIPTION OF PROCEDURE:  After adequate general endotracheal anesthesia, the patient was placed in dorsal supine position, legs in the [place] stirrups.  The patient's abdomen, perineum and vagina were prepped and draped in normal sterile fashion.   Timeout was performed.  The patient did receive gentamicin and clindamycin IV antibiotics for surgical prophylaxis.  Straight catheterization of the bladder yielded 100 mL of clear urine.  A single tooth tenaculum was placed on the anterior cervix and a  Kahn cannula was placed in the endocervical canal to be used for uterine manipulation during the case.  Gloves were changed.  Attention was directed to the patient's abdomen.  A 5 mm infraumbilical incision was made after injecting with 0.5% Marcaine.   The 5 mm laparoscope was advanced into the abdominal cavity with the Optiview cannula.  Upon entry into the endometrial cavity patient's abdomen was insufflated.  Second port site was placed in the left lower quadrant 3 cm medial to the left anterior   iliac spine.  Under direct visualization, a 5 mm trocar was advanced.  A 3rd port site was placed in the right lower quadrant, again 3 cm medial to the right anterior iliac spine.  A 5 mm trocar was advanced under direct visualization.  Initial  impression revealed normal peristaltic activity of the ureters, a 30-9 week size uterus with a prominent posterior uterine fibroid.  There was no evidence of endometriosis and the ovaries appeared normal.  Attention was directed to the patient's left  fallopian tube, which was grasped at the fimbriated end and Harmonic scalpel was brought up and the fallopian tube was dissected free from the mesosalpinx and left attached to the cornua.  Uteroovarian ligament was then transected as well as the round  ligament and the left uterine artery was skeletonized.  The bladder flap was opened with Harmonic scalpel.  The left uterine artery was cauterized and transected with Harmonic scalpel.  Similar procedure was repeated on the patient's right side.  Again,  the fallopian tube was dissected free from the mesosalpinx.  The uteroovarian ligament, round ligaments were cauterized and opened and the right uterine artery was skeletonized and transected.  Attention was then directed vaginally.  The anterior  cervical tenaculum was removed as well as the Kahn cannula.  The anterior and posterior cervix were grasped with thyroid tenacula and the cervix was circumferentially injected with 1% lidocaine with 1:100,000 epinephrine.  A direct posterior colpotomy  incision was made.  Upon entry into the posterior cul-de-sac long weighted bill speculum was placed.  Uterosacral ligaments were bilaterally clamped, transected, suture ligated with 0 Vicryl suture.  Anterior cervix was opened with the Bovie.  The  cardinal ligaments were bilaterally clamped, transected, suture ligated with 0 Vicryl suture.  Anterior cul-de-sac was entered sharply and the Deaver was placed within to elevate the  bladder anteriorly.  Two bites on each side of the uterus and  transection and suture ligated, freed the uterus and the uterus and fallopian tubes were delivered through the vagina.  Good hemostasis was noted.  The vaginal cuff was closed with a running 0 Vicryl suture with good hemostasis.  At the end of the  procedure straight catheterization of the bladder yielded another 25 mL of urine.  Attention was then redirected towards the abdomen where the patient's abdomen was reinsufflated.  Good hemostasis was noted, peristaltic activity was visualized of  bilateral ureters.  Procedure was complete.  The trocars were removed after deflating the patient's abdomen.  Each trocar site was closed with interrupted 4-0 Vicryl suture.  Sterile dressing applied.  COMPLICATIONS:  There were no complications.  ESTIMATED BLOOD LOSS:  25 mL.  INTRAOPERATIVE FLUIDS:  1100 mL.  URINE OUTPUT:  125 mL.  The patient was given 30 mg of intravenous Toradol at the end of the case and was taken to recovery room in good condition.  CN/NUANCE  D:09/22/2019 T:09/22/2019 JOB:[id number]

## 2019-09-22 NOTE — Anesthesia Preprocedure Evaluation (Signed)
Anesthesia Evaluation  Patient identified by MRN, date of birth, ID band Patient awake    Reviewed: Allergy & Precautions, NPO status , Patient's Chart, lab work & pertinent test results  History of Anesthesia Complications Negative for: history of anesthetic complications  Airway Mallampati: III  TM Distance: >3 FB Neck ROM: Full    Dental no notable dental hx.    Pulmonary asthma (no recent inhaler use) , neg sleep apnea,    breath sounds clear to auscultation- rhonchi (-) wheezing      Cardiovascular Exercise Tolerance: Good (-) hypertension(-) CAD, (-) Past MI, (-) Cardiac Stents and (-) CABG  Rhythm:Regular Rate:Normal - Systolic murmurs and - Diastolic murmurs    Neuro/Psych  Headaches, neg Seizures negative psych ROS   GI/Hepatic Neg liver ROS, GERD  ,  Endo/Other  negative endocrine ROSneg diabetes  Renal/GU negative Renal ROS     Musculoskeletal negative musculoskeletal ROS (+)   Abdominal (+) + obese,   Peds  Hematology negative hematology ROS (+)   Anesthesia Other Findings Past Medical History: No date: Asthma No date: Fibroids No date: GERD (gastroesophageal reflux disease) No date: Headache     Comment:  MIGRAINES 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)   Reproductive/Obstetrics                             Anesthesia Physical Anesthesia Plan  ASA: II  Anesthesia Plan: General   Post-op Pain Management:    Induction: Intravenous  PONV Risk Score and Plan: 2 and Dexamethasone, Ondansetron and Midazolam  Airway Management Planned: Oral ETT  Additional Equipment:   Intra-op Plan:   Post-operative Plan: Extubation in OR  Informed Consent: I have reviewed the patients History and Physical, chart, labs and discussed the procedure including the risks, benefits and alternatives for the proposed anesthesia with the patient or authorized  representative who has indicated his/her understanding and acceptance.     Dental advisory given  Plan Discussed with: CRNA and Anesthesiologist  Anesthesia Plan Comments:         Anesthesia Quick Evaluation

## 2019-09-22 NOTE — Anesthesia Procedure Notes (Signed)
Procedure Name: Intubation Date/Time: 09/22/2019 7:33 AM Performed by: Caryl Asp, CRNA Pre-anesthesia Checklist: Patient identified, Patient being monitored, Timeout performed, Emergency Drugs available and Suction available Patient Re-evaluated:Patient Re-evaluated prior to induction Oxygen Delivery Method: Circle system utilized Preoxygenation: Pre-oxygenation with 100% oxygen Induction Type: IV induction Ventilation: Mask ventilation without difficulty and Oral airway inserted - appropriate to patient size Laryngoscope Size: Mac and 3 Grade View: Grade I Tube type: Oral Tube size: 7.0 mm Number of attempts: 1 Airway Equipment and Method: Stylet and Video-laryngoscopy Placement Confirmation: ETT inserted through vocal cords under direct vision,  positive ETCO2 and breath sounds checked- equal and bilateral Secured at: 21 cm Tube secured with: Tape Dental Injury: Teeth and Oropharynx as per pre-operative assessment

## 2019-09-22 NOTE — Transfer of Care (Signed)
Immediate Anesthesia Transfer of Care Note  Patient: Kristina Lane  Procedure(s) Performed: LAPAROSCOPIC ASSISTED VAGINAL HYSTERECTOMY WITH SALPINGECTOMY (Bilateral )  Patient Location: PACU  Anesthesia Type:General  Level of Consciousness: drowsy  Airway & Oxygen Therapy: Patient Spontanous Breathing and Patient connected to face mask oxygen  Post-op Assessment: Report given to RN and Post -op Vital signs reviewed and stable  Post vital signs: Reviewed and stable  Last Vitals:  Vitals Value Taken Time  BP 132/70 09/22/19 0953  Temp    Pulse 107 09/22/19 0958  Resp 24 09/22/19 0956  SpO2 100 % 09/22/19 0958  Vitals shown include unvalidated device data.  Last Pain:  Vitals:   09/22/19 0649  TempSrc:   PainSc: 0-No pain         Complications: No complications documented.

## 2019-09-22 NOTE — Discharge Instructions (Addendum)
Laparoscopically Assisted Vaginal Hysterectomy, Care After This sheet gives you information about how to care for yourself after your procedure. Your health care provider may also give you more specific instructions. If you have problems or questions, contact your health care provider. What can I expect after the procedure? After the procedure, it is common to have:  Soreness and numbness in your incision areas.  Abdominal pain. You will be given pain medicine to control it.  Vaginal bleeding and discharge. You will need to use a sanitary napkin after this procedure.  Sore throat from the breathing tube that was inserted during surgery. Follow these instructions at home: Medicines  Take over-the-counter and prescription medicines only as told by your health care provider.  Do not take aspirin or ibuprofen. These medicines can cause bleeding.  Do not drive or use heavy machinery while taking prescription pain medicine.  Do not drive for 24 hours if you were given a medicine to help you relax (sedative) during the procedure. Incision care   Follow instructions from your health care provider about how to take care of your incisions. Make sure you: ? Wash your hands with soap and water before you change your bandage (dressing). If soap and water are not available, use hand sanitizer. ? Change your dressing as told by your health care provider. ? Leave stitches (sutures), skin glue, or adhesive strips in place. These skin closures may need to stay in place for 2 weeks or longer. If adhesive strip edges start to loosen and curl up, you may trim the loose edges. Do not remove adhesive strips completely unless your health care provider tells you to do that.  Check your incision area every day for signs of infection. Check for: ? Redness, swelling, or pain. ? Fluid or blood. ? Warmth. ? Pus or a bad smell. Activity  Get regular exercise as told by your health care provider. You may be  told to take short walks every day and go farther each time.  Return to your normal activities as told by your health care provider. Ask your health care provider what activities are safe for you.  Do not douche, use tampons, or have sexual intercourse for at least 6 weeks, or until your health care provider gives you permission.  Do not lift anything that is heavier than 10 lb (4.5 kg), or the limit that your health care provider tells you, until he or she says that it is safe. General instructions  Do not take baths, swim, or use a hot tub until your health care provider approves. Take showers instead of baths.  Do not drive for 24 hours if you received a sedative.  Do not drive or operate heavy machinery while taking prescription pain medicine.  To prevent or treat constipation while you are taking prescription pain medicine, your health care provider may recommend that you: ? Drink enough fluid to keep your urine clear or pale yellow. ? Take over-the-counter or prescription medicines. ? Eat foods that are high in fiber, such as fresh fruits and vegetables, whole grains, and beans. ? Limit foods that are high in fat and processed sugars, such as fried and sweet foods.  Keep all follow-up visits as told by your health care provider. This is important. Contact a health care provider if:  You have signs of infection, such as: ? Redness, swelling, or pain around your incision sites. ? Fluid or blood coming from an incision. ? An incision that feels warm to the   touch. ? Pus or a bad smell coming from an incision.  Your incision breaks open.  Your pain medicine is not helping.  You feel dizzy or light-headed.  You have pain or bleeding when you urinate.  You have persistent nausea and vomiting.  You have blood, pus, or a bad-smelling discharge from your vagina. Get help right away if:  You have a fever.  You have severe abdominal pain.  You have chest pain.  You have  shortness of breath.  You faint.  You have pain, swelling, or redness in your leg.  You have heavy bleeding from your vagina. Summary  After the procedure, it is common to have abdominal pain and vaginal bleeding.  You should not drive or lift heavy objects until your health care provider says that it is safe.  Contact your health care provider if you have any symptoms of infection, excessive vaginal bleeding, nausea, vomiting, or shortness of breath. This information is not intended to replace advice given to you by your health care provider. Make sure you discuss any questions you have with your health care provider. Document Revised: 03/02/2017 Document Reviewed: 05/16/2016 Elsevier Patient Education  2020 [employer]   AMBULATORY SURGERY  DISCHARGE INSTRUCTIONS   1) The drugs that you were given will stay in your system until tomorrow so for the next 24 hours you should not:  A) Drive an automobile B) Make any legal decisions C) Drink any alcoholic beverage   2) You may resume regular meals tomorrow.  Today it is better to start with liquids and gradually work up to solid foods.  You may eat anything you prefer, but it is better to start with liquids, then soup and crackers, and gradually work up to solid foods.   3) Please notify your doctor immediately if you have any unusual bleeding, trouble breathing, redness and pain at the surgery site, drainage, fever, or pain not relieved by medication.    4) Additional Instructions:        Please contact your physician with any problems or Same Day Surgery at [phone], Monday through Friday 6 am to 4 pm, or [facility] number at [phone]. 

## 2019-09-22 NOTE — Progress Notes (Signed)
Pt is ready for LAVH / bilateral salpingectomy  Labs reviewed . All questions answered . Proceed

## 2019-09-22 NOTE — Anesthesia Postprocedure Evaluation (Signed)
Anesthesia Post Note  Patient: Joclyn Frieson  Procedure(s) Performed: LAPAROSCOPIC ASSISTED VAGINAL HYSTERECTOMY WITH SALPINGECTOMY (Bilateral )  Patient location during evaluation: PACU Anesthesia Type: General Level of consciousness: awake and alert and oriented Pain management: pain level controlled Vital Signs Assessment: post-procedure vital signs reviewed and stable Respiratory status: spontaneous breathing, nonlabored ventilation and respiratory function stable Cardiovascular status: blood pressure returned to baseline and stable Postop Assessment: no signs of nausea or vomiting Anesthetic complications: no   No complications documented.   Last Vitals:  Vitals:   09/22/19 1227 09/22/19 1238  BP: 121/82 (!) 141/87  Pulse: 99 100  Resp: (!) 24 20  Temp: 36.5 C 36.5 C  SpO2: 98% 99%    Last Pain:  Vitals:   09/22/19 1238  TempSrc: Temporal  PainSc: 6                  Yaneisy Wenz

## 2019-09-22 NOTE — Brief Op Note (Signed)
09/22/2019  9:32 AM  PATIENT:  Kristina Lane  29 y.o. female  PRE-OPERATIVE DIAGNOSIS:  pelvic pain, fibroids  POST-OPERATIVE DIAGNOSIS:  pelvic pain, fibroids  PROCEDURE:  Procedure(s): LAPAROSCOPIC ASSISTED VAGINAL HYSTERECTOMY WITH SALPINGECTOMY (Bilateral)  SURGEON:  Surgeon(s) and Role:    * Randi Poullard, [name], MD - Primary    * Benjaman Kindler, MD - Assisting  PHYSICIAN ASSISTANT: CST  ASSISTANTS: none   ANESTHESIA:   general  EBL:  25 mL  IOF 1100 cc uo 100 cc  BLOOD ADMINISTERED:none  DRAINS: none   LOCAL MEDICATIONS USED:  MARCAINE     SPECIMEN:  Source of Specimen:  cervix , uterus and tubes   DISPOSITION OF SPECIMEN:  PATHOLOGY  COUNTS:  YES  TOURNIQUET:  * No tourniquets in log *  DICTATION: .Other Dictation: Dictation Number verbal  PLAN OF CARE: d/c from PACU PATIENT DISPOSITION:  PACU - hemodynamically stable.   Delay start of Pharmacological VTE agent (>24hrs) due to surgical blood loss or risk of bleeding: not applicable

## 2019-09-23 ENCOUNTER — Encounter: Payer: Self-pay | Admitting: Obstetrics and Gynecology

## 2019-09-23 LAB — SURGICAL PATHOLOGY

## 2019-10-09 DIAGNOSIS — H53149 Visual discomfort, unspecified: Secondary | ICD-10-CM | POA: Insufficient documentation

## 2019-10-09 DIAGNOSIS — R531 Weakness: Secondary | ICD-10-CM | POA: Insufficient documentation

## 2019-10-09 DIAGNOSIS — M792 Neuralgia and neuritis, unspecified: Secondary | ICD-10-CM | POA: Insufficient documentation

## 2019-10-09 DIAGNOSIS — R519 Headache, unspecified: Secondary | ICD-10-CM | POA: Insufficient documentation

## 2019-10-15 ENCOUNTER — Ambulatory Visit: Payer: Self-pay | Admitting: Plastic Surgery

## 2019-10-20 ENCOUNTER — Other Ambulatory Visit: Payer: Self-pay

## 2019-10-20 ENCOUNTER — Encounter (HOSPITAL_BASED_OUTPATIENT_CLINIC_OR_DEPARTMENT_OTHER): Payer: Self-pay | Admitting: *Deleted

## 2019-10-24 ENCOUNTER — Other Ambulatory Visit (HOSPITAL_COMMUNITY)
Admission: RE | Admit: 2019-10-24 | Discharge: 2019-10-24 | Disposition: A | Discharge status: Home / Self Care | Payer: BC Managed Care – PPO | Source: Ambulatory Visit | Attending: Plastic Surgery | Admitting: Plastic Surgery

## 2019-10-24 DIAGNOSIS — Z20822 Contact with and (suspected) exposure to covid-19: Secondary | ICD-10-CM | POA: Diagnosis not present

## 2019-10-24 DIAGNOSIS — Z01812 Encounter for preprocedural laboratory examination: Secondary | ICD-10-CM | POA: Diagnosis present

## 2019-10-24 LAB — SARS CORONAVIRUS 2 (TAT 6-24 HRS): SARS Coronavirus 2: NEGATIVE

## 2019-10-27 ENCOUNTER — Encounter: Payer: Self-pay | Admitting: Obstetrics and Gynecology

## 2019-10-27 NOTE — Anesthesia Preprocedure Evaluation (Addendum)
Anesthesia Evaluation  Patient identified by MRN, date of birth, ID band Patient awake    Reviewed: Allergy & Precautions, NPO status , Patient's Chart, lab work & pertinent test results  Airway Mallampati: III  TM Distance: >3 FB Neck ROM: Full    Dental no notable dental hx.    Pulmonary asthma ,    Pulmonary exam normal breath sounds clear to auscultation       Cardiovascular negative cardio ROS Normal cardiovascular exam Rhythm:Regular Rate:Normal     Neuro/Psych  Headaches, negative psych ROS   GI/Hepatic Neg liver ROS, GERD  Controlled,  Endo/Other  Morbid obesity  Renal/GU negative Renal ROS     Musculoskeletal negative musculoskeletal ROS (+)   Abdominal (+) + obese,   Peds  Hematology negative hematology ROS (+)   Anesthesia Other Findings BILATERAL MACROMASTIA  Reproductive/Obstetrics hcg negative                            Anesthesia Physical Anesthesia Plan  ASA: III  Anesthesia Plan: General   Post-op Pain Management:    Induction: Intravenous  PONV Risk Score and Plan: 3 and Ondansetron, Dexamethasone, Midazolam, Scopolamine patch - Pre-op and Treatment may vary due to age or medical condition  Airway Management Planned: Oral ETT  Additional Equipment:   Intra-op Plan:   Post-operative Plan: Extubation in OR  Informed Consent: I have reviewed the patients History and Physical, chart, labs and discussed the procedure including the risks, benefits and alternatives for the proposed anesthesia with the patient or authorized representative who has indicated his/her understanding and acceptance.     Dental advisory given  Plan Discussed with: CRNA  Anesthesia Plan Comments:        Anesthesia Quick Evaluation

## 2019-10-28 ENCOUNTER — Ambulatory Visit (HOSPITAL_BASED_OUTPATIENT_CLINIC_OR_DEPARTMENT_OTHER): Payer: BC Managed Care – PPO | Admitting: Anesthesiology

## 2019-10-28 ENCOUNTER — Ambulatory Visit (HOSPITAL_BASED_OUTPATIENT_CLINIC_OR_DEPARTMENT_OTHER)
Admission: RE | Admit: 2019-10-28 | Discharge: 2019-10-28 | Disposition: A | Discharge status: Home / Self Care | Payer: BC Managed Care – PPO | Attending: Plastic Surgery | Admitting: Plastic Surgery

## 2019-10-28 ENCOUNTER — Encounter (HOSPITAL_BASED_OUTPATIENT_CLINIC_OR_DEPARTMENT_OTHER): Payer: Self-pay | Admitting: Plastic Surgery

## 2019-10-28 ENCOUNTER — Other Ambulatory Visit: Payer: Self-pay

## 2019-10-28 ENCOUNTER — Encounter (HOSPITAL_BASED_OUTPATIENT_CLINIC_OR_DEPARTMENT_OTHER)
Admission: RE | Disposition: A | Discharge status: Home / Self Care | Payer: Self-pay | Source: Home / Self Care | Attending: Plastic Surgery

## 2019-10-28 DIAGNOSIS — J45909 Unspecified asthma, uncomplicated: Secondary | ICD-10-CM | POA: Insufficient documentation

## 2019-10-28 DIAGNOSIS — N62 Hypertrophy of breast: Secondary | ICD-10-CM | POA: Insufficient documentation

## 2019-10-28 DIAGNOSIS — Z803 Family history of malignant neoplasm of breast: Secondary | ICD-10-CM | POA: Diagnosis not present

## 2019-10-28 HISTORY — PX: BREAST REDUCTION SURGERY: SHX8

## 2019-10-28 SURGERY — MAMMOPLASTY, REDUCTION
Anesthesia: General | Site: Breast | Laterality: Bilateral

## 2019-10-28 MED ORDER — ESMOLOL HCL 100 MG/10ML IV SOLN
INTRAVENOUS | Status: DC | PRN
  Administered 2019-10-28 (×4): 20 mg via INTRAVENOUS
  Administered 2019-10-28 (×2): 10 mg via INTRAVENOUS

## 2019-10-28 MED ORDER — METOPROLOL TARTRATE 5 MG/5ML IV SOLN
INTRAVENOUS | Status: AC
  Filled 2019-10-28: qty 5

## 2019-10-28 MED ORDER — METOPROLOL TARTRATE 5 MG/5ML IV SOLN
INTRAVENOUS | Status: DC | PRN
  Administered 2019-10-28: 1 mg via INTRAVENOUS

## 2019-10-28 MED ORDER — DEXAMETHASONE SODIUM PHOSPHATE 10 MG/ML IJ SOLN
INTRAMUSCULAR | Status: DC | PRN
Start: 2019-10-28 — End: 2019-10-28
  Administered 2019-10-28: 5 mg via INTRAVENOUS

## 2019-10-28 MED ORDER — MIDAZOLAM HCL 5 MG/5ML IJ SOLN
INTRAMUSCULAR | Status: DC | PRN
  Administered 2019-10-28: 2 mg via INTRAVENOUS

## 2019-10-28 MED ORDER — PROPOFOL 10 MG/ML IV BOLUS
INTRAVENOUS | Status: AC
  Filled 2019-10-28: qty 20

## 2019-10-28 MED ORDER — FENTANYL CITRATE (PF) 250 MCG/5ML IJ SOLN
INTRAMUSCULAR | Status: DC | PRN
  Administered 2019-10-28 (×6): 50 ug via INTRAVENOUS

## 2019-10-28 MED ORDER — PROPOFOL 10 MG/ML IV BOLUS
INTRAVENOUS | Status: DC | PRN
  Administered 2019-10-28: 200 mg via INTRAVENOUS

## 2019-10-28 MED ORDER — ONDANSETRON HCL 4 MG/2ML IJ SOLN
INTRAMUSCULAR | Status: DC | PRN
  Administered 2019-10-28: 4 mg via INTRAVENOUS

## 2019-10-28 MED ORDER — ESMOLOL HCL 100 MG/10ML IV SOLN
INTRAVENOUS | Status: AC
  Filled 2019-10-28: qty 20

## 2019-10-28 MED ORDER — HYDROMORPHONE HCL 1 MG/ML IJ SOLN
INTRAMUSCULAR | Status: AC
  Filled 2019-10-28: qty 0.5

## 2019-10-28 MED ORDER — BACITRACIN ZINC 500 UNIT/GM EX OINT
TOPICAL_OINTMENT | CUTANEOUS | Status: DC | PRN
  Administered 2019-10-28: 1 via TOPICAL

## 2019-10-28 MED ORDER — FENTANYL CITRATE (PF) 100 MCG/2ML IJ SOLN
INTRAMUSCULAR | Status: AC
  Filled 2019-10-28: qty 2

## 2019-10-28 MED ORDER — ONDANSETRON HCL 4 MG/2ML IJ SOLN
INTRAMUSCULAR | Status: AC
  Filled 2019-10-28: qty 2

## 2019-10-28 MED ORDER — ROCURONIUM BROMIDE 10 MG/ML (PF) SYRINGE
PREFILLED_SYRINGE | INTRAVENOUS | Status: DC | PRN
  Administered 2019-10-28: 60 mg via INTRAVENOUS

## 2019-10-28 MED ORDER — CEFAZOLIN SODIUM-DEXTROSE 2-4 GM/100ML-% IV SOLN
INTRAVENOUS | Status: AC
  Filled 2019-10-28: qty 100

## 2019-10-28 MED ORDER — LACTATED RINGERS IV SOLN: INTRAVENOUS | Status: DC

## 2019-10-28 MED ORDER — SCOPOLAMINE 1 MG/3DAYS TD PT72
1.0000 | MEDICATED_PATCH | TRANSDERMAL | Status: DC
  Administered 2019-10-28: 1.5 mg via TRANSDERMAL

## 2019-10-28 MED ORDER — PROMETHAZINE HCL 25 MG/ML IJ SOLN: 6.2500 mg | INTRAMUSCULAR | Status: DC | PRN

## 2019-10-28 MED ORDER — KETOROLAC TROMETHAMINE 30 MG/ML IJ SOLN
INTRAMUSCULAR | Status: AC
  Filled 2019-10-28: qty 1

## 2019-10-28 MED ORDER — SCOPOLAMINE 1 MG/3DAYS TD PT72
MEDICATED_PATCH | TRANSDERMAL | Status: AC
  Filled 2019-10-28: qty 1

## 2019-10-28 MED ORDER — CHLORHEXIDINE GLUCONATE CLOTH 2 % EX PADS: 6.0000 | MEDICATED_PAD | Freq: Once | CUTANEOUS | Status: DC

## 2019-10-28 MED ORDER — SODIUM CHLORIDE (PF) 0.9 % IJ SOLN
INTRAMUSCULAR | Status: AC
  Filled 2019-10-28: qty 20

## 2019-10-28 MED ORDER — ACETAMINOPHEN 500 MG PO TABS
ORAL_TABLET | ORAL | Status: AC
  Filled 2019-10-28: qty 2

## 2019-10-28 MED ORDER — LIDOCAINE 2% (20 MG/ML) 5 ML SYRINGE
INTRAMUSCULAR | Status: DC | PRN
  Administered 2019-10-28: 60 mg via INTRAVENOUS

## 2019-10-28 MED ORDER — SODIUM CHLORIDE (PF) 0.9 % IJ SOLN
INTRAMUSCULAR | Status: DC | PRN
  Administered 2019-10-28: 60 mL

## 2019-10-28 MED ORDER — KETOROLAC TROMETHAMINE 30 MG/ML IJ SOLN
30.0000 mg | Freq: Once | INTRAMUSCULAR | Status: AC | PRN
  Administered 2019-10-28: 30 mg via INTRAVENOUS

## 2019-10-28 MED ORDER — 0.9 % SODIUM CHLORIDE (POUR BTL) OPTIME
TOPICAL | Status: DC | PRN
  Administered 2019-10-28 (×2): 500 mL

## 2019-10-28 MED ORDER — PHENYLEPHRINE 40 MCG/ML (10ML) SYRINGE FOR IV PUSH (FOR BLOOD PRESSURE SUPPORT)
PREFILLED_SYRINGE | INTRAVENOUS | Status: DC | PRN
  Administered 2019-10-28 (×2): 80 ug via INTRAVENOUS

## 2019-10-28 MED ORDER — SUGAMMADEX SODIUM 200 MG/2ML IV SOLN
INTRAVENOUS | Status: DC | PRN
  Administered 2019-10-28: 200 mg via INTRAVENOUS

## 2019-10-28 MED ORDER — MIDAZOLAM HCL 2 MG/2ML IJ SOLN
INTRAMUSCULAR | Status: AC
  Filled 2019-10-28: qty 2

## 2019-10-28 MED ORDER — ACETAMINOPHEN 500 MG PO TABS
1000.0000 mg | ORAL_TABLET | Freq: Once | ORAL | Status: AC
  Administered 2019-10-28: 1000 mg via ORAL

## 2019-10-28 MED ORDER — SODIUM CHLORIDE 0.9 % IV SOLN
INTRAVENOUS | Status: DC | PRN
  Administered 2019-10-28: 140 mL

## 2019-10-28 MED ORDER — OXYCODONE HCL 5 MG/5ML PO SOLN: 5.0000 mg | Freq: Once | ORAL | Status: DC | PRN

## 2019-10-28 MED ORDER — AMISULPRIDE (ANTIEMETIC) 5 MG/2ML IV SOLN
INTRAVENOUS | Status: AC
  Filled 2019-10-28: qty 4

## 2019-10-28 MED ORDER — HYDROMORPHONE HCL 1 MG/ML IJ SOLN
0.2500 mg | INTRAMUSCULAR | Status: DC | PRN
  Administered 2019-10-28: 0.5 mg via INTRAVENOUS

## 2019-10-28 MED ORDER — DEXAMETHASONE SODIUM PHOSPHATE 10 MG/ML IJ SOLN
INTRAMUSCULAR | Status: AC
  Filled 2019-10-28: qty 1

## 2019-10-28 MED ORDER — CEFAZOLIN SODIUM-DEXTROSE 2-4 GM/100ML-% IV SOLN
2.0000 g | INTRAVENOUS | Status: AC
  Administered 2019-10-28: 2 g via INTRAVENOUS

## 2019-10-28 MED ORDER — AMISULPRIDE (ANTIEMETIC) 5 MG/2ML IV SOLN
10.0000 mg | Freq: Once | INTRAVENOUS | Status: AC
  Administered 2019-10-28: 10 mg via INTRAVENOUS

## 2019-10-28 MED ORDER — OXYCODONE HCL 5 MG PO TABS: 5.0000 mg | ORAL_TABLET | Freq: Once | ORAL | Status: DC | PRN

## 2019-10-28 SURGICAL SUPPLY — 66 items
BAG DECANTER FOR FLEXI CONT (MISCELLANEOUS) ×3 IMPLANT
BLADE KNIFE PERSONA 10 (BLADE) ×12 IMPLANT
BLADE KNIFE PERSONA 15 (BLADE) ×7 IMPLANT
BNDG GAUZE ELAST 4 BULKY (GAUZE/BANDAGES/DRESSINGS) ×6 IMPLANT
CANISTER SUCT 1200ML W/VALVE (MISCELLANEOUS) ×3 IMPLANT
CAP BOUFFANT 24 BLUE NURSES (PROTECTIVE WEAR) ×3 IMPLANT
CLOSURE WOUND 1/2 X4 (GAUZE/BANDAGES/DRESSINGS) ×4
COVER BACK TABLE 60X90IN (DRAPES) ×3 IMPLANT
COVER MAYO STAND STRL (DRAPES) ×3 IMPLANT
COVER WAND RF STERILE (DRAPES) IMPLANT
DECANTER SPIKE VIAL GLASS SM (MISCELLANEOUS) ×2 IMPLANT
DEVICE DISSECT PLASMABLAD 3.0S (MISCELLANEOUS) IMPLANT
DRAIN CHANNEL 10F 3/8 F FF (DRAIN) ×6 IMPLANT
DRAPE U-SHAPE 76X120 STRL (DRAPES) ×4 IMPLANT
DRSG EMULSION OIL 3X3 NADH (GAUZE/BANDAGES/DRESSINGS) ×6 IMPLANT
DRSG PAD ABDOMINAL 8X10 ST (GAUZE/BANDAGES/DRESSINGS) ×6 IMPLANT
ELECT BLADE 4.0 EZ CLEAN MEGAD (MISCELLANEOUS)
ELECT CAUTERY BLADE 6.4 (BLADE) ×2 IMPLANT
ELECT REM PT RETURN 9FT ADLT (ELECTROSURGICAL) ×3
ELECTRODE BLDE 4.0 EZ CLN MEGD (MISCELLANEOUS) ×1 IMPLANT
ELECTRODE REM PT RTRN 9FT ADLT (ELECTROSURGICAL) ×1 IMPLANT
EVACUATOR SILICONE 100CC (DRAIN) ×6 IMPLANT
GAUZE SPONGE 4X4 12PLY STRL (GAUZE/BANDAGES/DRESSINGS) ×6 IMPLANT
GLOVE BIO SURGEON STRL SZ7 (GLOVE) ×5 IMPLANT
GLOVE BIOGEL PI IND STRL 6.5 (GLOVE) IMPLANT
GLOVE BIOGEL PI IND STRL 7.0 (GLOVE) IMPLANT
GLOVE BIOGEL PI INDICATOR 6.5 (GLOVE) ×2
GLOVE BIOGEL PI INDICATOR 7.0 (GLOVE) ×6
GLOVE SURG SS PI 7.0 STRL IVOR (GLOVE) ×6 IMPLANT
GOWN STRL REUS W/ TWL LRG LVL3 (GOWN DISPOSABLE) ×2 IMPLANT
GOWN STRL REUS W/TWL LRG LVL3 (GOWN DISPOSABLE) ×12
MARKER SKIN DUAL TIP RULER LAB (MISCELLANEOUS) ×2 IMPLANT
NDL HYPO 25X1 1.5 SAFETY (NEEDLE) ×3 IMPLANT
NDL SAFETY ECLIPSE 18X1.5 (NEEDLE) ×1 IMPLANT
NDL SPNL 18GX3.5 QUINCKE PK (NEEDLE) ×1 IMPLANT
NEEDLE HYPO 18GX1.5 SHARP (NEEDLE) ×3
NEEDLE HYPO 25X1 1.5 SAFETY (NEEDLE) ×9 IMPLANT
NEEDLE SPNL 18GX3.5 QUINCKE PK (NEEDLE) ×3 IMPLANT
NS IRRIG 1000ML POUR BTL (IV SOLUTION) ×6 IMPLANT
PACK BASIN DAY SURGERY FS (CUSTOM PROCEDURE TRAY) ×3 IMPLANT
PENCIL SMOKE EVACUATOR (MISCELLANEOUS) ×5 IMPLANT
PIN SAFETY STERILE (MISCELLANEOUS) ×3 IMPLANT
PLASMABLADE 3.0S (MISCELLANEOUS) ×3
SCRUB TECHNI CARE 4 OZ NO DYE (MISCELLANEOUS) ×3 IMPLANT
SLEEVE SCD COMPRESS KNEE MED (MISCELLANEOUS) ×3 IMPLANT
SPONGE LAP 18X18 RF (DISPOSABLE) ×11 IMPLANT
STAPLER VISISTAT 35W (STAPLE) ×5 IMPLANT
STRIP CLOSURE SKIN 1/2X4 (GAUZE/BANDAGES/DRESSINGS) ×8 IMPLANT
SUT ETHILON 3 0 PS 1 (SUTURE) ×3 IMPLANT
SUT MNCRL AB 3-0 PS2 18 (SUTURE) ×12 IMPLANT
SUT MNCRL AB 4-0 PS2 18 (SUTURE) ×10 IMPLANT
SUT MON AB 5-0 PS2 18 (SUTURE) ×6 IMPLANT
SUT PROLENE 2 0 CT2 30 (SUTURE) ×3 IMPLANT
SUT PROLENE 3 0 PS 1 (SUTURE) ×6 IMPLANT
SUT VLOC 90 P-14 23 (SUTURE) ×6 IMPLANT
SYR 20ML LL LF (SYRINGE) ×3 IMPLANT
SYR BULB IRRIG 60ML STRL (SYRINGE) ×6 IMPLANT
SYR CONTROL 10ML LL (SYRINGE) ×6 IMPLANT
TAPE MEASURE VINYL STERILE (MISCELLANEOUS) ×3 IMPLANT
TOWEL GREEN STERILE FF (TOWEL DISPOSABLE) ×9 IMPLANT
TRAY DSU PREP LF (CUSTOM PROCEDURE TRAY) ×3 IMPLANT
TRAY FOLEY W/BAG SLVR 14FR LF (SET/KITS/TRAYS/PACK) ×2 IMPLANT
TUBE CONNECTING 20'X1/4 (TUBING) ×2
TUBE CONNECTING 20X1/4 (TUBING) ×3 IMPLANT
UNDERPAD 30X36 HEAVY ABSORB (UNDERPADS AND DIAPERS) ×6 IMPLANT
YANKAUER SUCT BULB TIP NO VENT (SUCTIONS) ×3 IMPLANT

## 2019-10-28 NOTE — Brief Op Note (Signed)
10/28/2019  12:02 PM  PATIENT:  Kristina Lane  29 y.o. female  PRE-OPERATIVE DIAGNOSIS:  BILATERAL MACROMASTIA  POST-OPERATIVE DIAGNOSIS:  BILATERAL MACROMASTIA  PROCEDURE:  Procedure(s): BILATERAL MAMMARY REDUCTION  (BREAST) (Bilateral)  SURGEON:  Surgeon(s) and Role:    * Abdulhadi Stopa, [name], MD - Primary  ASSISTANTS: Rosalene Billings, RNFA   ANESTHESIA:   general  EBL:  100 cc   BLOOD ADMINISTERED:none  DRAINS: (25F) Jackson-Pratt drain(s) with closed bulb suction in the Bilateral Breasts   LOCAL MEDICATIONS USED: 1.3% Exparel (total 266 mgs.)  SPECIMEN:  Source of Specimen:  Bilateral Breasts  DISPOSITION OF SPECIMEN:  PATHOLOGY  COUNTS:  YES  DICTATION: .Note written in EPIC  PLAN OF CARE: Discharge to home after PACU  PATIENT DISPOSITION:  PACU - hemodynamically stable.   Delay start of Pharmacological VTE agent (>24hrs) due to surgical blood loss or risk of bleeding: not applicable

## 2019-10-28 NOTE — Transfer of Care (Signed)
Immediate Anesthesia Transfer of Care Note  Patient: Kristina Lane  Procedure(s) Performed: BILATERAL MAMMARY REDUCTION  (BREAST) (Bilateral Breast)  Patient Location: PACU  Anesthesia Type:General  Level of Consciousness: awake, alert , oriented and patient cooperative  Airway & Oxygen Therapy: Patient Spontanous Breathing and Patient connected to face mask oxygen  Post-op Assessment: Report given to RN, Post -op Vital signs reviewed and stable and Patient moving all extremities  Post vital signs: Reviewed and stable  Last Vitals:  Vitals Value Taken Time  BP 145/84 10/28/19 1230  Temp    Pulse 93 10/28/19 1232  Resp 19 10/28/19 1233  SpO2 100 % 10/28/19 1232  Vitals shown include unvalidated device data.  Last Pain:  Vitals:   10/28/19 0653  TempSrc: Oral  PainSc: 0-No pain      Patients Stated Pain Goal: 7 (86/77/37 3668)  Complications: No complications documented.

## 2019-10-28 NOTE — H&P (Signed)
  H&P faxed to surgical center.  -History and Physical Reviewed  -Patient has been re-examined  -No change in the plan of care  Shy Guallpa A    

## 2019-10-28 NOTE — Anesthesia Postprocedure Evaluation (Signed)
Anesthesia Post Note  Patient: Kristina Lane  Procedure(s) Performed: BILATERAL MAMMARY REDUCTION  (BREAST) (Bilateral Breast)     Patient location during evaluation: PACU Anesthesia Type: General Level of consciousness: awake and alert Pain management: pain level controlled Vital Signs Assessment: post-procedure vital signs reviewed and stable Respiratory status: spontaneous breathing, nonlabored ventilation, respiratory function stable and patient connected to nasal cannula oxygen Cardiovascular status: blood pressure returned to baseline and stable Postop Assessment: no apparent nausea or vomiting Anesthetic complications: no   No complications documented.  Last Vitals:  Vitals:   10/28/19 1546 10/28/19 1700  BP: 117/68 (!) 138/68  Pulse:    Resp:    Temp:    SpO2:      Last Pain:  Vitals:   10/28/19 1700  TempSrc:   PainSc: 0-No pain                 Erica Richwine P Corvette Orser

## 2019-10-28 NOTE — Discharge Instructions (Signed)
1. No lifting greater than 5 lbs with arms for 4 weeks. 2. Empty, strip, record and reactivate JP drains 3 times a day. 3. Percocet 5/325 mg tabs 1-2 tabs po q 4-6 hours prn pain- prescription given in office. 4. Duricef 1 tab po bid- prescription given in office. 5. Sterapred dose pack as directed- prescription given in office. 6. Follow-up appointment Friday in office.    PT TOOK TYLENOL AT 7AM/ NO TYLENOL PRODUCTS TILL AFTER 1pm      Post Anesthesia Home Care Instructions  Activity: Get plenty of rest for the remainder of the day. A responsible individual must stay with you for 24 hours following the procedure.  For the next 24 hours, DO NOT: -Drive a car -Paediatric nurse -Drink alcoholic beverages -Take any medication unless instructed by your physician -Make any legal decisions or sign important papers.  Meals: Start with liquid foods such as gelatin or soup. Progress to regular foods as tolerated. Avoid greasy, spicy, heavy foods. If nausea and/or vomiting occur, drink only clear liquids until the nausea and/or vomiting subsides. Call your physician if vomiting continues.  Special Instructions/Symptoms: Your throat may feel dry or sore from the anesthesia or the breathing tube placed in your throat during surgery. If this causes discomfort, gargle with warm salt water. The discomfort should disappear within 24 hours.  If you had a scopolamine patch placed behind your ear for the management of post- operative nausea and/or vomiting:  1. The medication in the patch is effective for 72 hours, after which it should be removed.  Wrap patch in a tissue and discard in the trash. Wash hands thoroughly with soap and water. 2. You may remove the patch earlier than 72 hours if you experience unpleasant side effects which may include dry mouth, dizziness or visual disturbances. 3. Avoid touching the patch. Wash your hands with soap and water after contact with the patch.    Call your  surgeon if you experience:   1.  Fever over 101.0. 2.  Inability to urinate. 3.  Nausea and/or vomiting. 4.  Extreme swelling or bruising at the surgical site. 5.  Continued bleeding from the incision. 6.  Increased pain, redness or drainage from the incision. 7.  Problems related to your pain medication. 8.  Any problems and/or concerns      JP Drain Totals  Bring this sheet to all of your post-operative appointments while you have your drains.  Please measure your drains by CC's or ML's.  Make sure you drain and measure your JP Drains 2 or 3 times per day.  At the end of each day, add up totals for the left side and add up totals for the right side.    ( 9 am )     ( 3 pm )        ( 9 pm )                Date L  R  L  R  L  R  Total L/R

## 2019-10-28 NOTE — Anesthesia Procedure Notes (Signed)
Procedure Name: Intubation Date/Time: 10/28/2019 7:49 AM Performed by: Myna Bright, CRNA Pre-anesthesia Checklist: Patient identified, Emergency Drugs available, Suction available and Patient being monitored Patient Re-evaluated:Patient Re-evaluated prior to induction Oxygen Delivery Method: Circle system utilized Preoxygenation: Pre-oxygenation with 100% oxygen Induction Type: IV induction Ventilation: Mask ventilation without difficulty Laryngoscope Size: Mac and 3 Grade View: Grade I Tube type: Oral Tube size: 7.0 mm Number of attempts: 1 Airway Equipment and Method: Stylet Placement Confirmation: ETT inserted through vocal cords under direct vision,  positive ETCO2 and breath sounds checked- equal and bilateral Secured at: 22 cm Tube secured with: Tape Dental Injury: Teeth and Oropharynx as per pre-operative assessment

## 2019-10-28 NOTE — Op Note (Signed)
OPERATIVE REPORT  10/28/2019  Kristina Lane  PREOPERATIVE DIAGNOSIS:  Bilateral macromastia.  POSTOPERATIVE DIAGNOSIS:  Bilateral macromastia.  PROCEDURE:  Bilateral reduction mammoplasties.  ATTENDING SURGEON:  Youlanda Roys, MD  ANESTHESIA:  General.  ANESTHESIOLOGIST:  , MD  COMPLICATIONS:  None.  INDICATIONS FOR THE PROCEDURE:  The patient is a 28 y.o. female who has bilateral macromastia that is clinically symptomatic.  She presents to undergo bilateral reduction mammoplasties.  DESCRIPTION OF PROCEDURE:  The patient was marked in preop holding area in a pattern of Wise for the future bilateral reduction mammoplasties. She was then taken back to the OR, placed on the table in supine position.  After adequate general anesthesia was obtained, the patient's chest was prepped with Techni-Care and draped in sterile fashion.  The bases of the breasts have been infiltrated with 1% lidocaine with epinephrine.  After adequate hemostasis and anesthesia taken effect, the procedure was begun.  Both of the breast reductions were performed in the following similar manner.  The nipple-areolar complex was marked with a 45-mm nipple marker.  The skin was then incised and deepithelialized around the nipple-areolar complex down to the inframammary crease in the inferior pedicle pattern.  Next, the medial, superior, and lateral skin flaps were elevated down to the chest wall.  Excess fat and glandular tissue removed from the inferior pedicle.  The nipple-areolar complex was examined and found to be pink and viable.  The wound was irrigated with saline irrigation.  Meticulous hemostasis was obtained with the Bovie electrocautery.  Inferior pedicle was centralized using 3-0 Prolene suture.  A #10 JP flat fully fluted drain was placed into the wound. The skin flaps were brought together at the inverted T junction with a 2- 0 Prolene suture.  The incisions were stapled for  temporary closure. The breasts compared and found to have good shape and symmetry.  The incisions were then closed from the medial aspect of the JP drain to the medial aspect of the [facility] incision by first placing a few 3-0 Monocryl sutures to tack together the dermal layer, and then both the dermal and cuticular layer were closed in a single layer using a 3-0 V-Lock barbed suture.  Lateral to the JP drain incision was closed using 3-0 Monocryl in the dermal layer, followed by 3-0 Monocryl running intracuticular stitch on the skin.  The vertical limb of the Wise pattern was closed in the dermal layer using 3-0 Monocryl suture.  The patient was placed in the upright position.  The future location of the nipple-areolar complexes was marked on both breast mounds using the 42-mm nipple marker.  She was then placed back in the recumbent position.  Both of the nipple areolar complexes were brought out onto the breast mounds in the following similar manner.  The skin was incised as marked and removed in full thickness into the subcutaneous tissues.  The nipple- areolar complex was examined, found to be pink and viable, then brought out through this aperture and sewn in place using 4-0 Monocryl in the dermal layer, followed by 5-0 Monocryl running intracuticular stitch on the skin.  This 5-0 Monocryl suture was then brought down to close the cuticular layer of the vertical limb as well.  The JP drain was sewn in place using 3-0 nylon suture.  The pectoralis major muscle and fascia along with the breast and chest soft tissues had been infiltrated with 1% Exparel (total 266 mg).  Now the [facility] incision was also infiltrated with the  Exparel in order to give the patient postoperative pain control.  The incisions were dressed with benzoin, Steri-Strips, and the nipples dressed with bacitracin ointment and Adaptic.  4x4s were placed over the incisions and ABD pads in the axillary areas.  The patient  was placed into a light postoperative support bra.  There were no complications. The patient tolerated the procedure well.  The final needle, sponge counts were reported to be correct at the end of the case.  The patient was then recovered without complications.  Both the patient and her family were given proper postoperative wound care instructions. She was then discharged home in the care of her family in stable condition.  Follow up will be with me in a few days in the office.         Youlanda Roys, M.D.  10/28/2019 12:05 PM

## 2019-10-29 ENCOUNTER — Encounter (HOSPITAL_BASED_OUTPATIENT_CLINIC_OR_DEPARTMENT_OTHER): Payer: Self-pay | Admitting: Plastic Surgery

## 2019-10-30 LAB — SURGICAL PATHOLOGY

## 2020-02-17 ENCOUNTER — Emergency Department: Payer: Medicaid Other

## 2020-02-17 ENCOUNTER — Other Ambulatory Visit: Payer: Self-pay

## 2020-02-17 ENCOUNTER — Emergency Department
Admission: EM | Admit: 2020-02-17 | Discharge: 2020-02-17 | Disposition: A | Discharge status: Home / Self Care | Payer: Medicaid Other | Attending: Emergency Medicine | Admitting: Emergency Medicine

## 2020-02-17 DIAGNOSIS — R42 Dizziness and giddiness: Secondary | ICD-10-CM | POA: Diagnosis present

## 2020-02-17 DIAGNOSIS — H66002 Acute suppurative otitis media without spontaneous rupture of ear drum, left ear: Secondary | ICD-10-CM | POA: Insufficient documentation

## 2020-02-17 DIAGNOSIS — J45909 Unspecified asthma, uncomplicated: Secondary | ICD-10-CM | POA: Diagnosis not present

## 2020-02-17 LAB — BASIC METABOLIC PANEL
Anion gap: 8 (ref 5–15)
BUN: 18 mg/dL (ref 6–20)
CO2: 25 mmol/L (ref 22–32)
Calcium: 8.7 mg/dL — ABNORMAL LOW (ref 8.9–10.3)
Chloride: 106 mmol/L (ref 98–111)
Creatinine, Ser: 0.77 mg/dL (ref 0.44–1.00)
GFR, Estimated: >60 mL/min (ref >60)
Glucose, Bld: 85 mg/dL (ref 70–99)
Potassium: 4 mmol/L (ref 3.5–5.1)
Sodium: 139 mmol/L (ref 135–145)

## 2020-02-17 LAB — CBC
HCT: 40.3 % (ref 36.0–46.0)
Hemoglobin: 13.3 g/dL (ref 12.0–15.0)
MCH: 27 pg (ref 26.0–34.0)
MCHC: 33 g/dL (ref 30.0–36.0)
MCV: 81.9 fL (ref 80.0–100.0)
Platelets: 281 10*3/uL (ref 150–400)
RBC: 4.92 MIL/uL (ref 3.87–5.11)
RDW: 14.6 % (ref 11.5–15.5)
WBC: 5.1 10*3/uL (ref 4.0–10.5)
nRBC: 0 % (ref 0.0–0.2)

## 2020-02-17 LAB — POC URINE PREG, ED: Preg Test, Ur: NEGATIVE

## 2020-02-17 MED ORDER — IBUPROFEN 600 MG PO TABS: 600.0000 mg | ORAL_TABLET | Freq: Three times a day (TID) | ORAL | 0 refills | Status: DC | PRN

## 2020-02-17 MED ORDER — MECLIZINE HCL 25 MG PO TABS
25.0000 mg | ORAL_TABLET | Freq: Once | ORAL | Status: AC
  Administered 2020-02-17: 25 mg via ORAL
  Filled 2020-02-17: qty 1

## 2020-02-17 MED ORDER — KETOROLAC TROMETHAMINE 30 MG/ML IJ SOLN
15.0000 mg | Freq: Once | INTRAMUSCULAR | Status: AC
  Administered 2020-02-17: 15 mg via INTRAVENOUS
  Filled 2020-02-17: qty 1

## 2020-02-17 MED ORDER — SODIUM CHLORIDE 0.9 % IV BOLUS
1000.0000 mL | Freq: Once | INTRAVENOUS | Status: AC
  Administered 2020-02-17: 1000 mL via INTRAVENOUS

## 2020-02-17 MED ORDER — MECLIZINE HCL 25 MG PO TABS
25.0000 mg | ORAL_TABLET | Freq: Three times a day (TID) | ORAL | 0 refills | Status: DC | PRN
Start: 2020-02-17 — End: 2020-04-26

## 2020-02-17 MED ORDER — AZITHROMYCIN 250 MG PO TABS
ORAL_TABLET | ORAL | 0 refills | Status: DC
Start: 2020-02-17 — End: 2020-04-26

## 2020-02-17 MED ORDER — LORAZEPAM 2 MG/ML IJ SOLN
1.0000 mg | Freq: Once | INTRAMUSCULAR | Status: AC
  Administered 2020-02-17: 1 mg via INTRAVENOUS
  Filled 2020-02-17: qty 1

## 2020-02-17 MED ORDER — DEXAMETHASONE SODIUM PHOSPHATE 10 MG/ML IJ SOLN
10.0000 mg | Freq: Once | INTRAMUSCULAR | Status: AC
  Administered 2020-02-17: 10 mg via INTRAVENOUS
  Filled 2020-02-17: qty 1

## 2020-02-17 NOTE — ED Notes (Signed)
Pt to CT

## 2020-02-17 NOTE — ED Provider Notes (Signed)
[facility] Emergency Department Provider Note  ____________________________________________   First MD Initiated Contact with Patient 02/17/20 1958     (approximate)  I have reviewed the triage vital signs and the nursing notes.   HISTORY  Chief Complaint Dizziness    HPI Kristina Lane is a 29 y.o. female  With h/o migraines, asthma, here with multiple complaints. Primary complaint is left ear pressure x 2-3 days, along with dizziness/room spinning sensation upon looking to the left. This has been ongoing x 2-3 days. She's had associated recent migraines x months that has been extensively worked up. She occasionally gets right-sided migraines w/ pain behing the eye, but has also had some left-sided HA recently. She also occasionally gets left-sided numbness with her migraines. 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 Sees Dr. Melrose Nakayama. No recent nasal congestion, pressure. No fevers or chills. No loss of balance. She has some chronic L calf/leg pain as well that has been attributed to sciatica, and this is mildly worsened. No other complaints.        Past Medical History:  Diagnosis Date  . Asthma   . Fibroids   . GERD (gastroesophageal reflux disease)   . Headache    MIGRAINES  . 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   Past Surgical History:  Procedure Laterality Date  . BREAST REDUCTION SURGERY Bilateral 10/28/2019   Procedure: BILATERAL MAMMARY REDUCTION  (BREAST);  Surgeon: [name], [name], MD;  Location: [place];  Service: Plastics;  Laterality: Bilateral;  . CESAREAN SECTION    . HERNIA REPAIR     UMBILICAL  . INSERTION OF MESH     TO REPLACE INFECTED MESH  . LAPAROSCOPIC VAGINAL HYSTERECTOMY WITH SALPINGECTOMY Bilateral 09/22/2019   Procedure: LAPAROSCOPIC ASSISTED VAGINAL HYSTERECTOMY WITH SALPINGECTOMY;   Surgeon: [name], [name], MD;  Location: [facility] ORS;  Service: Gynecology;  Laterality: Bilateral;    Prior to Admission medications   Medication Sig Start Date End Date Taking? Authorizing Provider  azithromycin (ZITHROMAX Z-PAK) 250 MG tablet Take 2 tablets (500 mg) on  Day 1,  followed by 1 tablet (250 mg) once daily on Days 2 through 5. 02/17/20   [name], MD  butalbital-acetaminophen-caffeine (FIORICET) [phone] MG tablet Take 1 tablet by mouth every 4 (four) hours as needed for headache or migraine.    [provider]  ibuprofen (ADVIL) 600 MG tablet Take 1 tablet (600 mg total) by mouth every 8 (eight) hours as needed for headache or moderate pain. 02/17/20   [name], MD  meclizine (ANTIVERT) 25 MG tablet Take 1 tablet (25 mg total) by mouth 3 (three) times daily as needed for dizziness. 02/17/20   [name], MD    Allergies Ciprofloxacin, Dilaudid [hydromorphone hcl], and Penicillins  Family History  Problem Relation Age of Onset  . Breast cancer Paternal Aunt   . Breast cancer Maternal Grandmother     Social History Social History   Tobacco Use  . Smoking status: Never Smoker  . Smokeless tobacco: Never Used  Vaping Use  . Vaping Use: Never assessed  Substance Use Topics  . Alcohol use: Not Currently  . Drug use: Never    Review of Systems  Review of Systems  Constitutional: Positive for fatigue. Negative for fever.  HENT: Positive for ear pain. Negative for congestion and sore throat.   Eyes: Negative for visual  disturbance.  Respiratory: Negative for cough and shortness of breath.   Cardiovascular: Negative for chest pain.  Gastrointestinal: Negative for abdominal pain, diarrhea, nausea and vomiting.  Genitourinary: Negative for flank pain.  Musculoskeletal: Negative for back pain and neck pain.  Skin: Negative for rash and wound.  Neurological: Positive for dizziness and headaches. Negative for weakness.  All other systems  reviewed and are negative.    ____________________________________________  PHYSICAL EXAM:      VITAL SIGNS: ED Triage Vitals  Enc Vitals Group     BP 02/17/20 1801 (!) 165/84     Pulse Rate 02/17/20 1801 98     Resp 02/17/20 1801 16     Temp 02/17/20 1801 98.6 F (37 C)     Temp Source 02/17/20 1801 Oral     SpO2 02/17/20 1801 100 %     Weight 02/17/20 1802 218 lb (98.9 kg)     Height 02/17/20 1802 5' (1.524 m)     Head Circumference --      Peak Flow --      Pain Score 02/17/20 1801 8     Pain Loc --      Pain Edu? --      Excl. in [place]? --      Physical Exam Vitals and nursing note reviewed.  Constitutional:      General: She is not in acute distress.    Appearance: She is well-developed.  HENT:     Head: Normocephalic and atraumatic.     Comments: Significant left-sided ear effusion with mild erythema, bulging TM. No mastoid tenderness or erythema.  Eyes:     Conjunctiva/sclera: Conjunctivae normal.  Cardiovascular:     Rate and Rhythm: Normal rate and regular rhythm.     Heart sounds: Normal heart sounds. No murmur heard.  No friction rub.  Pulmonary:     Effort: Pulmonary effort is normal. No respiratory distress.     Breath sounds: Normal breath sounds. No wheezing or rales.  Abdominal:     General: There is no distension.     Palpations: Abdomen is soft.     Tenderness: There is no abdominal tenderness.  Musculoskeletal:     Cervical back: Neck supple.  Skin:    General: Skin is warm.     Capillary Refill: Capillary refill takes less than 2 seconds.  Neurological:     Mental Status: She is alert and oriented to person, place, and time.     Motor: No abnormal muscle tone.     Comments: Neurological Exam:  Mental Status: Alert and oriented to person, place, and time. Attention and concentration normal. Speech clear. Recent memory is intact. Cranial Nerves: Visual fields grossly intact. EOMI and PERRLA. No nystagmus noted. Facial sensation intact at  forehead, maxillary cheek, and chin/mandible bilaterally. No facial asymmetry or weakness. Hearing grossly normal. Uvula is midline, and palate elevates symmetrically. Normal SCM and trapezius strength. Tongue midline without fasciculations. Motor: Muscle strength 5/5 in proximal and distal UE and LE bilaterally. No pronator drift. Muscle tone normal.  Sensation: Intact to light touch in upper and lower extremities distally bilaterally.  Gait: Normal without ataxia. Coordination: Normal FTN bilaterally.          ____________________________________________   LABS (all labs ordered are listed, but only abnormal results are displayed)  Labs Reviewed  BASIC METABOLIC PANEL - Abnormal; Notable for the following components:      Result Value   Calcium 8.7 (*)    All other  components within normal limits  CBC  URINALYSIS, COMPLETE (UACMP) WITH MICROSCOPIC  CBG MONITORING, ED  POC URINE PREG, ED    ____________________________________________  EKG:  ________________________________________  RADIOLOGY All imaging, including plain films, CT scans, and ultrasounds, independently reviewed by me, and interpretations confirmed via formal radiology reads.  ED MD interpretation:   CT Head: [place]  Official radiology report(s): CT Head Wo Contrast  Result Date: 02/17/2020 CLINICAL DATA:  Vertigo EXAM: CT HEAD WITHOUT CONTRAST TECHNIQUE: Contiguous axial images were obtained from the base of the skull through the vertex without intravenous contrast. COMPARISON:  None. FINDINGS: Brain: There is no acute intracranial hemorrhage, mass effect, or edema. Gray-white differentiation is preserved. There is no extra-axial fluid collection. Ventricles and sulci are within normal limits in size and configuration. Vascular: No hyperdense vessel or unexpected calcification. Skull: Calvarium is unremarkable. Sinuses/Orbits: No acute finding. Other: Mastoid air cells are clear. IMPRESSION: No acute  intracranial abnormality. Electronically Signed   By: Macy Mis M.D.   On: 02/17/2020 20:53    ____________________________________________  PROCEDURES   Procedure(s) performed (including Critical Care):  Procedures  ____________________________________________  INITIAL IMPRESSION / MDM / [place] / ED COURSE  As part of my medical decision making, I reviewed the following data within the [place] notes reviewed and incorporated, Old chart reviewed, Notes from prior ED visits, and [place] Controlled Substance Database       *Derionna Grahn was evaluated in Emergency Department on 02/17/2020 for the symptoms described in the history of present illness. She was evaluated in the context of the global COVID-19 pandemic, which necessitated consideration that the patient might be at risk for infection with the SARS-CoV-2 virus that causes COVID-19. Institutional protocols and algorithms that pertain to the evaluation of patients at risk for COVID-19 are in a state of rapid change based on information released by regulatory bodies including the CDC and federal and state organizations. These policies and algorithms were followed during the patient's care in the ED.  Some ED evaluations and interventions may be delayed as a result of limited staffing during the pandemic.*     Medical Decision Making:  29 yo F here with dizziness, ear pressure, left sided pain. Re: her dizziness and ear pressure, pt has unilateral ear redness, bulging TM consistent with likely AOM. I suspect she has a component of peripheral vertigo with this, with reproducible, unilateral horizontal nystagmus that is positional on dix-hallpike maneuver, with no nystagmus at rest, no gaze abnormality, no other neuro deficits and negative CT head - doubt central vertigo. Pt may also have a component of complex migraine as she has a h/o these with left-sided sx.   Sx markedly improved with  ativan/meclizine. Vitals stable (HTN noted, which is not abnormal for her per report). Re: her pain - she has exam c/w possible sciatica vs atypical migraine and neuropathy. No emergent pathology.   Will d/c with tx for AOM, meclizine. Return precautions given.   ____________________________________________  FINAL CLINICAL IMPRESSION(S) / ED DIAGNOSES  Final diagnoses:  Vertigo  Non-recurrent acute suppurative otitis media of left ear without spontaneous rupture of tympanic membrane     MEDICATIONS GIVEN DURING THIS VISIT:  Medications  sodium chloride 0.9 % bolus 1,000 mL (0 mLs Intravenous Stopped 02/17/20 2246)  ketorolac (TORADOL) 30 MG/ML injection 15 mg (15 mg Intravenous Given 02/17/20 2100)  meclizine (ANTIVERT) tablet 25 mg (25 mg Oral Given 02/17/20 2032)  LORazepam (ATIVAN) injection 1 mg (1 mg  Intravenous Given 02/17/20 2032)  dexamethasone (DECADRON) injection 10 mg (10 mg Intravenous Given 02/17/20 2150)  meclizine (ANTIVERT) tablet 25 mg (25 mg Oral Given 02/17/20 2259)     ED Discharge Orders         Ordered    meclizine (ANTIVERT) 25 MG tablet  3 times daily PRN        02/17/20 2227    azithromycin (ZITHROMAX Z-PAK) 250 MG tablet        02/17/20 2227    ibuprofen (ADVIL) 600 MG tablet  Every 8 hours PRN        02/17/20 2227           Note:  This document was prepared using Dragon voice recognition software and may include unintentional dictation errors.   [name], MD 02/17/20 [phone]

## 2020-02-17 NOTE — ED Triage Notes (Signed)
Pt reports dizziness for the past 72 hours, states that she has a hx of migraines reports that she usually has migraines over the right eye and 4 days ago she had it over the left eye, states that every time she gets up and moves she has dizziness as if the room is spinning, pt states that she also has tightness on the left jaw and is having pain in her left shin, pt reports that she feels like her left side is numb.

## 2020-02-19 ENCOUNTER — Other Ambulatory Visit: Payer: Self-pay

## 2020-02-19 ENCOUNTER — Ambulatory Visit: Payer: Medicaid Other | Admitting: Gastroenterology

## 2020-02-19 DIAGNOSIS — R102 Pelvic and perineal pain unspecified side: Secondary | ICD-10-CM | POA: Insufficient documentation

## 2020-02-19 DIAGNOSIS — H9313 Tinnitus, bilateral: Secondary | ICD-10-CM | POA: Insufficient documentation

## 2020-02-19 DIAGNOSIS — R200 Anesthesia of skin: Secondary | ICD-10-CM | POA: Insufficient documentation

## 2020-02-23 ENCOUNTER — Other Ambulatory Visit: Payer: Self-pay | Admitting: Neurology

## 2020-02-23 ENCOUNTER — Ambulatory Visit: Payer: Medicaid Other | Admitting: Gastroenterology

## 2020-02-23 ENCOUNTER — Encounter: Payer: Self-pay | Admitting: *Deleted

## 2020-02-23 DIAGNOSIS — G35 Multiple sclerosis: Secondary | ICD-10-CM

## 2020-03-04 ENCOUNTER — Other Ambulatory Visit: Payer: Self-pay

## 2020-03-04 ENCOUNTER — Other Ambulatory Visit
Admission: RE | Admit: 2020-03-04 | Discharge: 2020-03-04 | Disposition: A | Discharge status: Home / Self Care | Payer: Medicaid Other | Source: Ambulatory Visit | Attending: Gastroenterology | Admitting: Gastroenterology

## 2020-03-04 DIAGNOSIS — Z01812 Encounter for preprocedural laboratory examination: Secondary | ICD-10-CM | POA: Insufficient documentation

## 2020-03-04 DIAGNOSIS — Z20822 Contact with and (suspected) exposure to covid-19: Secondary | ICD-10-CM | POA: Diagnosis not present

## 2020-03-04 LAB — SARS CORONAVIRUS 2 (TAT 6-24 HRS): SARS Coronavirus 2: NEGATIVE

## 2020-03-05 ENCOUNTER — Encounter: Payer: Self-pay | Admitting: Emergency Medicine

## 2020-03-07 LAB — SARS CORONAVIRUS 2 (TAT 6-24 HRS): SARS Coronavirus 2: NEGATIVE

## 2020-03-08 ENCOUNTER — Encounter
Admission: RE | Disposition: A | Discharge status: Home / Self Care | Payer: Self-pay | Source: Home / Self Care | Attending: Gastroenterology

## 2020-03-08 ENCOUNTER — Ambulatory Visit: Payer: Medicaid Other | Admitting: Certified Registered"

## 2020-03-08 ENCOUNTER — Ambulatory Visit
Admission: RE | Admit: 2020-03-08 | Discharge: 2020-03-08 | Disposition: A | Discharge status: Home / Self Care | Payer: Medicaid Other | Attending: Gastroenterology | Admitting: Gastroenterology

## 2020-03-08 ENCOUNTER — Other Ambulatory Visit: Payer: Self-pay

## 2020-03-08 ENCOUNTER — Ambulatory Visit: Payer: Medicaid Other

## 2020-03-08 ENCOUNTER — Encounter: Payer: Self-pay | Admitting: *Deleted

## 2020-03-08 DIAGNOSIS — R1013 Epigastric pain: Secondary | ICD-10-CM | POA: Diagnosis not present

## 2020-03-08 DIAGNOSIS — Z885 Allergy status to narcotic agent status: Secondary | ICD-10-CM | POA: Diagnosis not present

## 2020-03-08 DIAGNOSIS — K920 Hematemesis: Secondary | ICD-10-CM | POA: Insufficient documentation

## 2020-03-08 DIAGNOSIS — Z881 Allergy status to other antibiotic agents status: Secondary | ICD-10-CM | POA: Insufficient documentation

## 2020-03-08 DIAGNOSIS — K449 Diaphragmatic hernia without obstruction or gangrene: Secondary | ICD-10-CM | POA: Insufficient documentation

## 2020-03-08 DIAGNOSIS — K2210 Ulcer of esophagus without bleeding: Secondary | ICD-10-CM | POA: Insufficient documentation

## 2020-03-08 DIAGNOSIS — Z880 Allergy status to penicillin: Secondary | ICD-10-CM | POA: Insufficient documentation

## 2020-03-08 DIAGNOSIS — K3189 Other diseases of stomach and duodenum: Secondary | ICD-10-CM | POA: Diagnosis not present

## 2020-03-08 DIAGNOSIS — K219 Gastro-esophageal reflux disease without esophagitis: Secondary | ICD-10-CM | POA: Insufficient documentation

## 2020-03-08 DIAGNOSIS — Z79899 Other long term (current) drug therapy: Secondary | ICD-10-CM | POA: Diagnosis not present

## 2020-03-08 HISTORY — PX: ESOPHAGOGASTRODUODENOSCOPY: SHX5428

## 2020-03-08 SURGERY — EGD (ESOPHAGOGASTRODUODENOSCOPY)
Anesthesia: General

## 2020-03-08 MED ORDER — MIDAZOLAM HCL 2 MG/2ML IJ SOLN
INTRAMUSCULAR | Status: AC
  Filled 2020-03-08: qty 2

## 2020-03-08 MED ORDER — LIDOCAINE HCL (CARDIAC) PF 100 MG/5ML IV SOSY
PREFILLED_SYRINGE | INTRAVENOUS | Status: DC | PRN
  Administered 2020-03-08: 100 mg via INTRAVENOUS

## 2020-03-08 MED ORDER — MIDAZOLAM HCL 2 MG/2ML IJ SOLN
INTRAMUSCULAR | Status: DC | PRN
  Administered 2020-03-08: 2 mg via INTRAVENOUS

## 2020-03-08 MED ORDER — GLYCOPYRROLATE 0.2 MG/ML IJ SOLN
INTRAMUSCULAR | Status: DC | PRN
  Administered 2020-03-08: .2 mg via INTRAVENOUS

## 2020-03-08 MED ORDER — PROPOFOL 10 MG/ML IV BOLUS
INTRAVENOUS | Status: DC | PRN
  Administered 2020-03-08: 60 mg via INTRAVENOUS
  Administered 2020-03-08 (×2): 20 mg via INTRAVENOUS

## 2020-03-08 MED ORDER — PROPOFOL 500 MG/50ML IV EMUL
INTRAVENOUS | Status: DC | PRN
  Administered 2020-03-08: 145 ug/kg/min via INTRAVENOUS

## 2020-03-08 MED ORDER — SODIUM CHLORIDE 0.9 % IV SOLN
INTRAVENOUS | Status: DC
  Administered 2020-03-08: 20 mL/h via INTRAVENOUS

## 2020-03-08 MED ORDER — ESMOLOL HCL 100 MG/10ML IV SOLN
INTRAVENOUS | Status: DC | PRN
  Administered 2020-03-08: 20 mg via INTRAVENOUS

## 2020-03-08 NOTE — H&P (Signed)
Outpatient short stay form Pre-procedure 03/08/2020 10:37 AM Kristina Miyamoto MD, MPH  Primary Physician: Dr. Ola Spurr  Reason for visit:  GERD/Epigastric pain  History of present illness:   29 y/o lady with history of a variety of GI symptoms including GERD, epigastric pain, nausea, and vomiting. Improved somewhat with PPI. History of ventral hernia repairs and c-sections. No blood thinners. No family history of GI malignancies. Does endorse constipation with improvement of her symptoms with bowel movements.    Current Facility-Administered Medications:  .  0.9 %  sodium chloride infusion, , Intravenous, Continuous, Locklear, Hilton Cork, MD, Last Rate: 20 mL/hr at 03/08/20 1014, 20 mL/hr at 03/08/20 1014  Medications Prior to Admission  Medication Sig Dispense Refill Last Dose  . azithromycin (ZITHROMAX Z-PAK) 250 MG tablet Take 2 tablets (500 mg) on  Day 1,  followed by 1 tablet (250 mg) once daily on Days 2 through 5. 6 each 0 Past Week at Unknown time  . butalbital-acetaminophen-caffeine (FIORICET) 50-325-40 MG tablet Take 1 tablet by mouth every 4 (four) hours as needed for headache or migraine.   Past Week at Unknown time  . ergocalciferol (VITAMIN D2) 1.25 MG (50000 UT) capsule Take 1 capsule by mouth daily.   Past Week at Unknown time  . furosemide (LASIX) 20 MG tablet Take 20 mg by mouth.   Past Week at Unknown time  . gabapentin (NEURONTIN) 300 MG capsule Take 1 capsule by mouth at bedtime.   Past Week at Unknown time  . ibuprofen (ADVIL) 600 MG tablet Take 1 tablet (600 mg total) by mouth every 8 (eight) hours as needed for headache or moderate pain. 20 tablet 0 Past Week at Unknown time  . meclizine (ANTIVERT) 25 MG tablet Take 1 tablet (25 mg total) by mouth 3 (three) times daily as needed for dizziness. 30 tablet 0 Past Week at Unknown time  . mirabegron ER (MYRBETRIQ) 25 MG TB24 tablet Take 25 mg by mouth daily.   Past Week at Unknown time  . nortriptyline (PAMELOR) 10 MG  capsule Take 10 mg by mouth 2 (two) times daily.   Past Week at Unknown time  . ondansetron (ZOFRAN-ODT) 4 MG disintegrating tablet Take 4 mg by mouth every 8 (eight) hours as needed for nausea or vomiting.   Past Week at Unknown time  . pantoprazole (PROTONIX) 40 MG tablet Take 40 mg by mouth daily.   Past Week at Unknown time     Allergies  Allergen Reactions  . Ciprofloxacin Anaphylaxis  . Dilaudid [Hydromorphone Hcl]   . Penicillins Swelling     Past Medical History:  Diagnosis Date  . Asthma   . Fibroids   . GERD (gastroesophageal reflux disease)   . Headache    MIGRAINES  . 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    Physical Exam  Gen: Alert, oriented. Appears stated age.  HEENT: PERRLA. Lungs: No respiratory distress CV: RRR Abd: soft, benign, no masses.  Ext: No edema.     Planned procedures: Proceed with EGD. The patient understands the nature of the planned procedure, indications, risks, alternatives and potential complications including but not limited to bleeding, infection, perforation, damage to internal organs and possible oversedation/side effects from anesthesia. The patient agrees and gives consent to proceed.  Please refer to procedure notes for findings, recommendations and patient disposition/instructions.     Kristina Miyamoto MD, MPH Gastroenterology 03/08/2020  10:37 AM

## 2020-03-08 NOTE — Interval H&P Note (Signed)
History and Physical Interval Note:  03/08/2020 10:39 AM  Kristina Lane  has presented today for surgery, with the diagnosis of DYSPEPSIA,EPIGASTRIC PAIN.  The various methods of treatment have been discussed with the patient and family. After consideration of risks, benefits and other options for treatment, the patient has consented to  Procedure(s): ESOPHAGOGASTRODUODENOSCOPY (EGD) (N/A) as a surgical intervention.  The patient's history has been reviewed, patient examined, no change in status, stable for surgery.  I have reviewed the patient's chart and labs.  Questions were answered to the patient's satisfaction.     [name]  Ok to proceed with EGD

## 2020-03-08 NOTE — Anesthesia Procedure Notes (Signed)
Procedure Name: General with mask airway Performed by: Fletcher-Harrison, Beatryce Colombo, CRNA Pre-anesthesia Checklist: Patient identified, Emergency Drugs available, Suction available and Patient being monitored Patient Re-evaluated:Patient Re-evaluated prior to induction Oxygen Delivery Method: Simple face mask Induction Type: IV induction Placement Confirmation: positive ETCO2 and CO2 detector Dental Injury: Teeth and Oropharynx as per pre-operative assessment        

## 2020-03-08 NOTE — Transfer of Care (Signed)
Immediate Anesthesia Transfer of Care Note  Patient: Kristina Lane  Procedure(s) Performed: ESOPHAGOGASTRODUODENOSCOPY (EGD) (N/A )  Patient Location: Endoscopy Unit  Anesthesia Type:General  Level of Consciousness: drowsy and responds to stimulation  Airway & Oxygen Therapy: Patient Spontanous Breathing and Patient connected to face mask oxygen  Post-op Assessment: Report given to RN and Post -op Vital signs reviewed and stable  Post vital signs: Reviewed and stable  Last Vitals:  Vitals Value Taken Time  BP 126/52 03/08/20 1059  Temp 36.1 C 03/08/20 1059  Pulse 104 03/08/20 1102  Resp 22 03/08/20 1102  SpO2 100 % 03/08/20 1102  Vitals shown include unvalidated device data.  Last Pain:  Vitals:   03/08/20 1059  TempSrc: Temporal  PainSc: Asleep         Complications: No complications documented.

## 2020-03-08 NOTE — Op Note (Signed)
[facility] Gastroenterology Patient Name: Kristina Lane Procedure Date: 03/08/2020 10:42 AM MRN: [id number] Account #: [id number] Date of Birth: [birth year] Admit Type: Outpatient Age: 29 Room: [facility] ENDO ROOM 3 Gender: Female Note Status: Finalized Procedure:             Upper GI endoscopy Indications:           Epigastric abdominal pain, Dyspepsia,                         Gastro-esophageal reflux disease, Hematemesis Providers:             Andrey Farmer MD, MD Referring MD:          Adrian Prows (Referring MD) Medicines:             Monitored Anesthesia Care Complications:         No immediate complications. Estimated blood loss:                         Minimal. Procedure:             Pre-Anesthesia Assessment:                        - Prior to the procedure, a History and Physical was                         performed, and patient medications and allergies were                         reviewed. The patient is competent. The risks and                         benefits of the procedure and the sedation options and                         risks were discussed with the patient. All questions                         were answered and informed consent was obtained.                         Patient identification and proposed procedure were                         verified by the physician, the nurse, the anesthetist                         and the technician in the endoscopy suite. Mental                         Status Examination: alert and oriented. Airway                         Examination: normal oropharyngeal airway and neck                         mobility. Respiratory Examination: clear to                         auscultation.  CV Examination: normal. Prophylactic                         Antibiotics: The patient does not require prophylactic                         antibiotics. Prior Anticoagulants: The patient has                         taken no  previous anticoagulant or antiplatelet                         agents. ASA Grade Assessment: III - A patient with                         severe systemic disease. After reviewing the risks and                         benefits, the patient was deemed in satisfactory                         condition to undergo the procedure. The anesthesia                         plan was to use monitored anesthesia care (MAC).                         Immediately prior to administration of medications,                         the patient was re-assessed for adequacy to receive                         sedatives. The heart rate, respiratory rate, oxygen                         saturations, blood pressure, adequacy of pulmonary                         ventilation, and response to care were monitored                         throughout the procedure. The physical status of the                         patient was re-assessed after the procedure.                        After obtaining informed consent, the endoscope was                         passed under direct vision. Throughout the procedure,                         the patient's blood pressure, pulse, and oxygen                         saturations were monitored continuously. The Endoscope  was introduced through the mouth, and advanced to the                         second part of duodenum. The upper GI endoscopy was                         accomplished without difficulty. The patient tolerated                         the procedure well. Findings:      The examined esophagus was normal.      A 5 cm hiatal hernia with a single Tate Jerkins erosion was found.      The entire examined stomach was normal. Biopsies were taken with a cold       forceps for Helicobacter pylori testing. Estimated blood loss was       minimal.      Localized mildly erythematous mucosa without active bleeding and with no       stigmata of bleeding was found in the  duodenal bulb.      The exam was otherwise without abnormality. Impression:            - Normal esophagus.                        - 5 cm hiatal hernia with a single Austin Pongratz erosion.                        - Normal stomach. Biopsied.                        - Erythematous duodenopathy.                        - The examination was otherwise normal. Recommendation:        - Discharge patient to home.                        - Resume previous diet.                        - Continue present medications.                        - Await pathology results.                        - Return to referring physician as previously                         scheduled.                        - Miralax 1 capful (17 grams) in 8 ounces of water PO                         daily.                        - Use fiber, for example Citrucel, Fibercon, Konsyl or  Metamucil. Procedure Code(s):     --- Professional ---                        [phone], Esophagogastroduodenoscopy, flexible,                         transoral; with biopsy, single or multiple Diagnosis Code(s):     --- Professional ---                        K44.9, Diaphragmatic hernia without obstruction or                         gangrene                        K25.9, Gastric ulcer, unspecified as acute or chronic,                         without hemorrhage or perforation                        K31.89, Other diseases of stomach and duodenum                        R10.13, Epigastric pain                        K21.9, Gastro-esophageal reflux disease without                         esophagitis                        K92.0, Hematemesis CPT copyright 2019 American Medical Association. All rights reserved. The codes documented in this report are preliminary and upon coder review may  be revised to meet current compliance requirements. Andrey Farmer, MD Andrey Farmer MD, MD 03/08/2020 11:04:06 AM Number of Addenda: 0 Note Initiated  On: 03/08/2020 10:42 AM Estimated Blood Loss:  Estimated blood loss was minimal.      [facility]

## 2020-03-08 NOTE — Anesthesia Preprocedure Evaluation (Signed)
Anesthesia Evaluation  Patient identified by MRN, date of birth, ID band Patient awake    Reviewed: Allergy & Precautions, NPO status , Patient's Chart, lab work & pertinent test results  Airway Mallampati: III  TM Distance: >3 FB Neck ROM: Full    Dental no notable dental hx.    Pulmonary asthma ,    Pulmonary exam normal breath sounds clear to auscultation       Cardiovascular negative cardio ROS Normal cardiovascular exam Rhythm:Regular Rate:Normal     Neuro/Psych  Headaches, negative psych ROS   GI/Hepatic Neg liver ROS, GERD  Controlled,  Endo/Other  Morbid obesity  Renal/GU negative Renal ROS     Musculoskeletal negative musculoskeletal ROS (+)   Abdominal (+) + obese,   Peds  Hematology negative hematology ROS (+)   Anesthesia Other Findings BILATERAL MACROMASTIA  Reproductive/Obstetrics hcg negative                             Anesthesia Physical  Anesthesia Plan  ASA: III  Anesthesia Plan: General   Post-op Pain Management:    Induction: Intravenous  PONV Risk Score and Plan: Propofol infusion  Airway Management Planned: Nasal Cannula  Additional Equipment:   Intra-op Plan:   Post-operative Plan:   Informed Consent: I have reviewed the patients History and Physical, chart, labs and discussed the procedure including the risks, benefits and alternatives for the proposed anesthesia with the patient or authorized representative who has indicated his/her understanding and acceptance.     Dental advisory given  Plan Discussed with: CRNA  Anesthesia Plan Comments:         Anesthesia Quick Evaluation

## 2020-03-09 ENCOUNTER — Encounter: Payer: Self-pay | Admitting: Gastroenterology

## 2020-03-09 NOTE — Anesthesia Postprocedure Evaluation (Signed)
Anesthesia Post Note  Patient: Arnola S Fleissner  Procedure(s) Performed: ESOPHAGOGASTRODUODENOSCOPY (EGD) (N/A )  Patient location during evaluation: Endoscopy Anesthesia Type: General Level of consciousness: awake and alert and oriented Pain management: pain level controlled Vital Signs Assessment: post-procedure vital signs reviewed and stable Respiratory status: spontaneous breathing Cardiovascular status: blood pressure returned to baseline Anesthetic complications: no   No complications documented.   Last Vitals:  Vitals:   03/08/20 1119 03/08/20 1129  BP: 127/75 132/80  Pulse: (!) 111 (!) 108  Resp: (!) 27 18  Temp:    SpO2: 95% 95%    Last Pain:  Vitals:   03/08/20 1129  TempSrc:   PainSc: 0-No pain                 Zadyn Yardley

## 2020-03-10 LAB — SURGICAL PATHOLOGY

## 2020-03-16 ENCOUNTER — Ambulatory Visit: Payer: Medicaid Other

## 2020-03-25 ENCOUNTER — Other Ambulatory Visit: Payer: Self-pay | Admitting: Gastroenterology

## 2020-03-25 DIAGNOSIS — K449 Diaphragmatic hernia without obstruction or gangrene: Secondary | ICD-10-CM

## 2020-03-25 DIAGNOSIS — R1013 Epigastric pain: Secondary | ICD-10-CM

## 2020-03-25 DIAGNOSIS — R1319 Other dysphagia: Secondary | ICD-10-CM

## 2020-03-31 ENCOUNTER — Other Ambulatory Visit: Payer: Self-pay

## 2020-03-31 ENCOUNTER — Ambulatory Visit
Admission: RE | Admit: 2020-03-31 | Discharge: 2020-03-31 | Disposition: A | Discharge status: Home / Self Care | Payer: Medicaid Other | Source: Ambulatory Visit | Attending: Gastroenterology | Admitting: Gastroenterology

## 2020-03-31 DIAGNOSIS — K449 Diaphragmatic hernia without obstruction or gangrene: Secondary | ICD-10-CM | POA: Insufficient documentation

## 2020-03-31 DIAGNOSIS — R1013 Epigastric pain: Secondary | ICD-10-CM | POA: Diagnosis present

## 2020-03-31 DIAGNOSIS — R1319 Other dysphagia: Secondary | ICD-10-CM | POA: Diagnosis present

## 2020-04-11 ENCOUNTER — Emergency Department
Admission: EM | Admit: 2020-04-11 | Discharge: 2020-04-11 | Disposition: A | Discharge status: Home / Self Care | Payer: Medicaid Other | Attending: Emergency Medicine | Admitting: Emergency Medicine

## 2020-04-11 ENCOUNTER — Emergency Department: Payer: Medicaid Other

## 2020-04-11 ENCOUNTER — Encounter: Payer: Self-pay | Admitting: Emergency Medicine

## 2020-04-11 ENCOUNTER — Other Ambulatory Visit: Payer: Self-pay

## 2020-04-11 DIAGNOSIS — Y92410 Unspecified street and highway as the place of occurrence of the external cause: Secondary | ICD-10-CM | POA: Insufficient documentation

## 2020-04-11 DIAGNOSIS — Z79899 Other long term (current) drug therapy: Secondary | ICD-10-CM | POA: Insufficient documentation

## 2020-04-11 DIAGNOSIS — M79652 Pain in left thigh: Secondary | ICD-10-CM | POA: Diagnosis not present

## 2020-04-11 DIAGNOSIS — M542 Cervicalgia: Secondary | ICD-10-CM | POA: Diagnosis not present

## 2020-04-11 DIAGNOSIS — J4520 Mild intermittent asthma, uncomplicated: Secondary | ICD-10-CM | POA: Diagnosis not present

## 2020-04-11 MED ORDER — IBUPROFEN 800 MG PO TABS
800.0000 mg | ORAL_TABLET | Freq: Once | ORAL | Status: AC
  Administered 2020-04-11: 800 mg via ORAL
  Filled 2020-04-11: qty 1

## 2020-04-11 MED ORDER — METHOCARBAMOL 500 MG PO TABS: 500.0000 mg | ORAL_TABLET | Freq: Three times a day (TID) | ORAL | 0 refills | Status: AC | PRN

## 2020-04-11 MED ORDER — MELOXICAM 15 MG PO TABS: 15.0000 mg | ORAL_TABLET | Freq: Every day | ORAL | 2 refills | Status: DC

## 2020-04-11 NOTE — Discharge Instructions (Signed)
Take Meloxicam and Robaxin as directed.  
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
## 2020-04-28 NOTE — H&P (Signed)
Ms. Pooley is a 30 y.o. female here for L/S for persistent pelvic pain s/p LAVH   follow up for Right sided pelvic pain that change from pressure sensation( prior to surgery) to sharp in nature s/p LAVH and bilateral salpingectomy 6/21  Pt underwent a LAVH for fibroids and pelvic pain. Since surgery she has experienced some right sided pain that comes on 2-3 x / week sharp and lasts for 15-20- min .she does complain of constipation and has recently undergone a EGD which has showed a hiatal hernia . And colonoscopy in sept that showed polyps .  For the last  Few month she has c/o dyspareunia on the right side .  Marland Kitchen Pain prevent her from engaging in sexual activity   Path form original surgery failed to show  Endometriosis  patient also c/o left inguinal mons pain that comes and goes .   Overall she feels her pain started after her c/s 7 yrs ago , but it has changed in nature since my surgery  Dyspareunia is now the main issue   U/s :  Transvag & Transabd scans performed   Hysterectomy  No free fluid seen  Rt ovary appears wnl  Lt complex septated ovarian cyst=1.7cm; septation=0.16cm     Past Medical History:  has a past medical history of Asthma, unspecified asthma severity, unspecified whether complicated, unspecified whether persistent, GERD (gastroesophageal reflux disease) (07/10/2019), History of stroke, Localized edema (07/10/2019), Obesity, Obesity, Class III, BMI 40-49.9 (morbid obesity) (CMS-HCC) (07/10/2019), and Poor intravenous access.  Past Surgical History:  has a past surgical history that includes Hernia repair; Cesarean section (2014); colonoscopy (N/A, 08/28/2019); LAVH with bilateral salpingectomy (09/22/2019); Hysterectomy (09/22/2019); and egd (03/08/2020). Family History: family history includes Arthritis in her paternal grandmother; Breast cancer in her maternal grandmother and paternal aunt; Kidney disease in her father. Social History:  reports that she  has never smoked. She has never used smokeless tobacco. She reports that she does not drink alcohol and does not use drugs. OB/GYN History:          OB History    Gravida  2   Para  2   Term  1   Preterm  1   AB      Living  2     SAB      IAB      Ectopic      Molar      Multiple      Live Births  2          Allergies: is allergic to ciprofloxacin, penicillin, and dilaudid [hydromorphone]. Medications:  Current Outpatient Medications:  .  nortriptyline (PAMELOR) 10 MG capsule, Start Nortriptyline (Pamelor) 10 mg nightly for one week, then increase to 20 mg nightly (Patient taking differently: 20 mg nightly  ), Disp: 60 capsule, Rfl: 3 .  azithromycin (ZITHROMAX) 500 MG tablet, Take 500 mg by mouth once daily    (Patient not taking: Reported on 04/21/2020  ), Disp: , Rfl:  .  FUROsemide (LASIX) 20 MG tablet, TAKE 1 TABLET(20 MG) BY MOUTH EVERY DAY AS NEEDED FOR SWELLING (Patient not taking: Reported on 04/21/2020), Disp: 30 tablet, Rfl: 0 .  meclizine (ANTIVERT) 25 mg tablet, Take 25 mg by mouth 3 (three) times daily as needed for Dizziness    (Patient not taking: Reported on 04/21/2020  ), Disp: , Rfl:  .  mirabegron (MYRBETRIQ) 25 mg ER Tablet, Take 1 tablet (25 mg total) by mouth once daily (Patient not  taking: Reported on 04/21/2020  ), Disp: 30 tablet, Rfl: 11 .  pantoprazole (PROTONIX) 40 MG DR tablet, Take 1 tablet (40 mg total) by mouth 2 (two) times daily before meals Take 15-20 mins before meal (Patient not taking: Reported on 04/21/2020  ), Disp: 60 tablet, Rfl: 11 .  polyethylene glycol (MIRALAX) powder, Mix in 4-8ounces of fluid prior to taking, take twice a day (Patient not taking: Reported on 03/25/2020  ), Disp: 255 g, Rfl: 0 .  sucralfate (CARAFATE) 1 gram tablet, Take 1 tablet (1 g total) by mouth 2 (two) times daily before meals (Patient not taking: Reported on 04/21/2020  ), Disp: 60 tablet, Rfl: 11 .  tolterodine (DETROL LA) 2 MG LA capsule, Take 1  capsule (2 mg total) by mouth once daily (Patient not taking: Reported on 03/25/2020  ), Disp: 30 capsule, Rfl: 6  Review of Systems: General:                      No fatigue or weight loss Eyes:                           No vision changes Ears:                            No hearing difficulty Respiratory:                No cough or shortness of breath Pulmonary:                  No asthma or shortness of breath Cardiovascular:           No chest pain, palpitations, dyspnea on exertion Gastrointestinal:          No abdominal bloating, chronic diarrhea, constipations, masses, pain or hematochezia Genitourinary:             No hematuria, dysuria, abnormal vaginal discharge, pelvic pain, Menometrorrhagia Lymphatic:                   No swollen lymph nodes Musculoskeletal:         No muscle weakness Neurologic:                  No extremity weakness, syncope, seizure disorder Psychiatric:                  No history of depression, delusions or suicidal/homicidal ideation    Exam:      Vitals:   04/29/20 1021  BP: (!) 140/95  Pulse: 109    Body mass index is 43.55 kg/m.  WDWN  black female in NAD   Lungs: CTA  CV : RRR without murmur    Neck:  no thyromegaly Abdomen: soft , no mass, normal active bowel sounds,  non-tender, no rebound tenderness Pelvic: tanner stage 5 ,  External genitalia: vulva /labia no lesions Left lower mons - small area TTP , no definitive cyst / mass Urethra: no prolapse Vagina: normal physiologic d/c Cervix:absent Uterus: absent  Adnexa: no mass,  non-tender   Rectovaginal:  Impression:   The primary encounter diagnosis was Pelvic pain in female. A diagnosis of Dyspareunia, female was also pertinent to this visit.  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  Alternatively operative L/S to eval ovarian / cuff involvement that could be the source of pain .   She has elected for surgery. I did address the role of right oophorectomy given her pain persists on the right and she declines this currently    risks of the procedure have been discussed with the patient    Caroline Sauger, MD

## 2020-04-30 ENCOUNTER — Encounter
Admission: RE | Admit: 2020-04-30 | Discharge: 2020-04-30 | Disposition: A | Discharge status: Home / Self Care | Payer: No Typology Code available for payment source | Source: Ambulatory Visit | Attending: Obstetrics and Gynecology | Admitting: Obstetrics and Gynecology

## 2020-04-30 ENCOUNTER — Other Ambulatory Visit: Payer: Self-pay

## 2020-04-30 HISTORY — DX: Anemia, unspecified: D64.9

## 2020-04-30 HISTORY — DX: Migraine, unspecified, not intractable, without status migrainosus: G43.909

## 2020-04-30 HISTORY — DX: Other specified postprocedural states: Z98.890

## 2020-04-30 HISTORY — DX: Other specified postprocedural states: R11.2

## 2020-04-30 NOTE — Patient Instructions (Addendum)
Your procedure is scheduled on: Friday, February 11 Report to the Registration Desk on the 1st floor of the [facility]. To find out your arrival time, please call [phone] between 1PM - 3PM on: Thursday, February 10  REMEMBER: Instructions that are not followed completely may result in serious medical risk, up to and including death; or upon the discretion of your surgeon and anesthesiologist your surgery may need to be rescheduled.  Do not eat food after midnight the night before surgery.  No gum chewing, lozengers or hard candies.  You may however, drink CLEAR liquids up to 2 hours before you are scheduled to arrive for your surgery. Do not drink anything within 2 hours of your scheduled arrival time.  Clear liquids include: - water  - apple juice without pulp - gatorade (not RED, PURPLE, OR BLUE) - black coffee or tea (Do NOT add milk or creamers to the coffee or tea) Do NOT drink anything that is not on this list.  TAKE THESE MEDICATIONS THE MORNING OF SURGERY WITH A SIP OF WATER:  1.  Nortriptyline 2.  Pantoprazole - (take one the night before and one on the morning of surgery - helps to prevent nausea after surgery.)  One week prior to surgery:  Starting February 4 Stop Anti-inflammatories (NSAIDS) such as Advil, Aleve, Ibuprofen, Motrin, Naproxen, Naprosyn and Aspirin based products such as Excedrin, Goodys Powder, BC Powder. Stop ANY OVER THE COUNTER supplements until after surgery.  No Alcohol for 24 hours before or after surgery.  No Smoking including e-cigarettes for 24 hours prior to surgery.  No chewable tobacco products for at least 6 hours prior to surgery.  No nicotine patches on the day of surgery.  Do not use any "recreational" drugs for at least a week prior to your surgery.  Please be advised that the combination of cocaine and anesthesia may have negative outcomes, up to and including death. If you test positive for cocaine, your surgery will be  cancelled.  On the morning of surgery brush your teeth with toothpaste and water, you may rinse your mouth with mouthwash if you wish. Do not swallow any toothpaste or mouthwash.  Do not wear jewelry, make-up, hairpins, clips or nail polish.  Do not wear lotions, powders, or perfumes.   Do not shave body from the neck down 48 hours prior to surgery just in case you cut yourself which could leave a site for infection.  Also, freshly shaved skin may become irritated if using the CHG soap.  Contact lenses, hearing aids and dentures may not be worn into surgery.  Do not bring valuables to the hospital. [facility] is not responsible for any missing/lost belongings or valuables.   Use CHG Soap as directed on instruction sheet.  Notify your doctor if there is any change in your medical condition (cold, fever, infection).  Wear comfortable clothing (specific to your surgery type) to the hospital.  Plan for stool softeners for home use; pain medications have a tendency to cause constipation. You can also help prevent constipation by eating foods high in fiber such as fruits and vegetables and drinking plenty of fluids as your diet allows.  After surgery, you can help prevent lung complications by doing breathing exercises.  Take deep breaths and cough every 1-2 hours. Your doctor may order a device called an Incentive Spirometer to help you take deep breaths. When coughing or sneezing, hold a pillow firmly against your incision with both hands. This is called "splinting."  Doing this helps protect your incision. It also decreases belly discomfort.  If you are being discharged the day of surgery, you will not be allowed to drive home. You will need a responsible adult (18 years or older) to drive you home and stay with you that night.   If you are taking public transportation, you will need to have a responsible adult (18 years or older) with you. Please confirm with your physician that it is  acceptable to use public transportation.   Please call the [place] [facility] at [phone] if you have any questions about these instructions.  Visitation Policy:  Patients undergoing a surgery or procedure may have one family member or support person with them as long as that person is not COVID-19 positive or experiencing its symptoms.  That person may remain in the waiting area during the procedure.

## 2020-05-12 ENCOUNTER — Other Ambulatory Visit: Payer: No Typology Code available for payment source

## 2020-05-12 ENCOUNTER — Other Ambulatory Visit: Payer: Self-pay

## 2020-05-12 ENCOUNTER — Encounter
Admission: RE | Admit: 2020-05-12 | Discharge: 2020-05-12 | Disposition: A | Discharge status: Home / Self Care | Payer: Medicaid Other | Source: Ambulatory Visit | Attending: Obstetrics and Gynecology | Admitting: Obstetrics and Gynecology

## 2020-05-12 DIAGNOSIS — Z01812 Encounter for preprocedural laboratory examination: Secondary | ICD-10-CM | POA: Insufficient documentation

## 2020-05-12 DIAGNOSIS — Z20822 Contact with and (suspected) exposure to covid-19: Secondary | ICD-10-CM | POA: Insufficient documentation

## 2020-05-12 LAB — CBC
HCT: 39.2 % (ref 36.0–46.0)
Hemoglobin: 13.2 g/dL (ref 12.0–15.0)
MCH: 28.2 pg (ref 26.0–34.0)
MCHC: 33.7 g/dL (ref 30.0–36.0)
MCV: 83.8 fL (ref 80.0–100.0)
Platelets: 268 10*3/uL (ref 150–400)
RBC: 4.68 MIL/uL (ref 3.87–5.11)
RDW: 14.7 % (ref 11.5–15.5)
WBC: 5.8 10*3/uL (ref 4.0–10.5)
nRBC: 0 % (ref 0.0–0.2)

## 2020-05-12 LAB — BASIC METABOLIC PANEL
Anion gap: 9 (ref 5–15)
BUN: 15 mg/dL (ref 6–20)
CO2: 26 mmol/L (ref 22–32)
Calcium: 9 mg/dL (ref 8.9–10.3)
Chloride: 102 mmol/L (ref 98–111)
Creatinine, Ser: 0.57 mg/dL (ref 0.44–1.00)
GFR, Estimated: >60 mL/min (ref >60)
Glucose, Bld: 91 mg/dL (ref 70–99)
Potassium: 3.8 mmol/L (ref 3.5–5.1)
Sodium: 137 mmol/L (ref 135–145)

## 2020-05-12 LAB — TYPE AND SCREEN
ABO/RH(D): A POS
Antibody Screen: NEGATIVE

## 2020-05-12 LAB — SARS CORONAVIRUS 2 (TAT 6-24 HRS): SARS Coronavirus 2: NEGATIVE

## 2020-05-14 ENCOUNTER — Ambulatory Visit
Admission: RE | Admit: 2020-05-14 | Discharge: 2020-05-14 | Disposition: A | Discharge status: Home / Self Care | Payer: Medicaid Other | Attending: Obstetrics and Gynecology | Admitting: Obstetrics and Gynecology

## 2020-05-14 ENCOUNTER — Ambulatory Visit: Payer: Medicaid Other | Admitting: Anesthesiology

## 2020-05-14 ENCOUNTER — Encounter
Admission: RE | Disposition: A | Discharge status: Home / Self Care | Payer: Self-pay | Source: Home / Self Care | Attending: Obstetrics and Gynecology

## 2020-05-14 ENCOUNTER — Other Ambulatory Visit: Payer: Self-pay

## 2020-05-14 ENCOUNTER — Encounter: Payer: Self-pay | Admitting: Obstetrics and Gynecology

## 2020-05-14 DIAGNOSIS — R102 Pelvic and perineal pain: Secondary | ICD-10-CM | POA: Diagnosis not present

## 2020-05-14 DIAGNOSIS — Z90710 Acquired absence of both cervix and uterus: Secondary | ICD-10-CM | POA: Diagnosis not present

## 2020-05-14 DIAGNOSIS — Z881 Allergy status to other antibiotic agents status: Secondary | ICD-10-CM | POA: Diagnosis not present

## 2020-05-14 DIAGNOSIS — Z79899 Other long term (current) drug therapy: Secondary | ICD-10-CM | POA: Insufficient documentation

## 2020-05-14 DIAGNOSIS — N736 Female pelvic peritoneal adhesions (postinfective): Secondary | ICD-10-CM | POA: Diagnosis not present

## 2020-05-14 DIAGNOSIS — Z880 Allergy status to penicillin: Secondary | ICD-10-CM | POA: Diagnosis not present

## 2020-05-14 DIAGNOSIS — Z885 Allergy status to narcotic agent status: Secondary | ICD-10-CM | POA: Diagnosis not present

## 2020-05-14 DIAGNOSIS — H53149 Visual discomfort, unspecified: Secondary | ICD-10-CM | POA: Diagnosis not present

## 2020-05-14 DIAGNOSIS — Z6841 Body Mass Index (BMI) 40.0 and over, adult: Secondary | ICD-10-CM | POA: Diagnosis not present

## 2020-05-14 DIAGNOSIS — K219 Gastro-esophageal reflux disease without esophagitis: Secondary | ICD-10-CM | POA: Diagnosis not present

## 2020-05-14 DIAGNOSIS — N941 Unspecified dyspareunia: Secondary | ICD-10-CM | POA: Insufficient documentation

## 2020-05-14 DIAGNOSIS — J4520 Mild intermittent asthma, uncomplicated: Secondary | ICD-10-CM | POA: Diagnosis not present

## 2020-05-14 HISTORY — PX: LYSIS OF ADHESION: SHX5961

## 2020-05-14 HISTORY — PX: LAPAROSCOPY: SHX197

## 2020-05-14 SURGERY — LAPAROSCOPY, DIAGNOSTIC
Anesthesia: General

## 2020-05-14 MED ORDER — DEXAMETHASONE SODIUM PHOSPHATE 10 MG/ML IJ SOLN
INTRAMUSCULAR | Status: DC | PRN
  Administered 2020-05-14: 4 mg via INTRAVENOUS

## 2020-05-14 MED ORDER — ONDANSETRON HCL 4 MG PO TABS: 8.0000 mg | ORAL_TABLET | Freq: Two times a day (BID) | ORAL | Status: DC

## 2020-05-14 MED ORDER — MIDAZOLAM HCL 5 MG/5ML IJ SOLN
INTRAMUSCULAR | Status: DC | PRN
  Administered 2020-05-14: 2 mg via INTRAVENOUS

## 2020-05-14 MED ORDER — SCOPOLAMINE 1 MG/3DAYS TD PT72: 1.0000 | MEDICATED_PATCH | TRANSDERMAL | Status: DC

## 2020-05-14 MED ORDER — ORAL CARE MOUTH RINSE: 15.0000 mL | Freq: Once | OROMUCOSAL | Status: AC

## 2020-05-14 MED ORDER — ACETAMINOPHEN 500 MG PO TABS: 1000.0000 mg | ORAL_TABLET | ORAL | Status: AC

## 2020-05-14 MED ORDER — ACETAMINOPHEN 500 MG PO TABS
ORAL_TABLET | ORAL | Status: AC
  Administered 2020-05-14: 1000 mg via ORAL
  Filled 2020-05-14: qty 2

## 2020-05-14 MED ORDER — BUPIVACAINE HCL 0.5 % IJ SOLN
INTRAMUSCULAR | Status: DC | PRN
  Administered 2020-05-14: 20 mL

## 2020-05-14 MED ORDER — LIDOCAINE HCL (CARDIAC) PF 100 MG/5ML IV SOSY
PREFILLED_SYRINGE | INTRAVENOUS | Status: DC | PRN
  Administered 2020-05-14: 100 mg via INTRATRACHEAL

## 2020-05-14 MED ORDER — POVIDONE-IODINE 10 % EX SWAB: 2.0000 "application " | Freq: Once | CUTANEOUS | Status: DC

## 2020-05-14 MED ORDER — KETOROLAC TROMETHAMINE 30 MG/ML IJ SOLN
INTRAMUSCULAR | Status: DC | PRN
  Administered 2020-05-14: 30 mg via INTRAVENOUS

## 2020-05-14 MED ORDER — PROPOFOL 10 MG/ML IV BOLUS
INTRAVENOUS | Status: DC | PRN
  Administered 2020-05-14: 200 mg via INTRAVENOUS

## 2020-05-14 MED ORDER — GABAPENTIN 300 MG PO CAPS
ORAL_CAPSULE | ORAL | Status: AC
  Administered 2020-05-14: 300 mg via ORAL
  Filled 2020-05-14: qty 1

## 2020-05-14 MED ORDER — SUGAMMADEX SODIUM 200 MG/2ML IV SOLN
INTRAVENOUS | Status: DC | PRN
  Administered 2020-05-14: 200 mg via INTRAVENOUS

## 2020-05-14 MED ORDER — SCOPOLAMINE 1 MG/3DAYS TD PT72
MEDICATED_PATCH | TRANSDERMAL | Status: AC
  Administered 2020-05-14: 1.5 mg via TRANSDERMAL
  Filled 2020-05-14: qty 1

## 2020-05-14 MED ORDER — MIDAZOLAM HCL 2 MG/2ML IJ SOLN
INTRAMUSCULAR | Status: AC
  Filled 2020-05-14: qty 2

## 2020-05-14 MED ORDER — ROCURONIUM 10MG/ML (10ML) SYRINGE FOR MEDFUSION PUMP - OPTIME
INTRAVENOUS | Status: DC | PRN
  Administered 2020-05-14: 10 mg via INTRAVENOUS
  Administered 2020-05-14: 50 mg via INTRAVENOUS
  Administered 2020-05-14: 10 mg via INTRAVENOUS

## 2020-05-14 MED ORDER — CHLORHEXIDINE GLUCONATE 0.12 % MT SOLN
OROMUCOSAL | Status: AC
  Administered 2020-05-14: 15 mL via OROMUCOSAL
  Filled 2020-05-14: qty 15

## 2020-05-14 MED ORDER — HYDROCODONE-ACETAMINOPHEN 5-325 MG PO TABS
ORAL_TABLET | ORAL | Status: AC
  Filled 2020-05-14: qty 2

## 2020-05-14 MED ORDER — PROPOFOL 500 MG/50ML IV EMUL
INTRAVENOUS | Status: DC | PRN
  Administered 2020-05-14: 200 ug/kg/min via INTRAVENOUS

## 2020-05-14 MED ORDER — HYDROCODONE-ACETAMINOPHEN 5-325 MG PO TABS: 1.0000 | ORAL_TABLET | ORAL | Status: DC | PRN

## 2020-05-14 MED ORDER — PROPOFOL 10 MG/ML IV BOLUS
INTRAVENOUS | Status: AC
  Filled 2020-05-14: qty 20

## 2020-05-14 MED ORDER — ONDANSETRON HCL 4 MG/2ML IJ SOLN
INTRAMUSCULAR | Status: DC | PRN
  Administered 2020-05-14: 4 mg via INTRAVENOUS

## 2020-05-14 MED ORDER — FENTANYL CITRATE (PF) 100 MCG/2ML IJ SOLN
INTRAMUSCULAR | Status: AC
  Filled 2020-05-14: qty 2

## 2020-05-14 MED ORDER — GABAPENTIN 300 MG PO CAPS: 300.0000 mg | ORAL_CAPSULE | ORAL | Status: AC

## 2020-05-14 MED ORDER — LACTATED RINGERS IV SOLN: INTRAVENOUS | Status: DC

## 2020-05-14 MED ORDER — CHLORHEXIDINE GLUCONATE 0.12 % MT SOLN: 15.0000 mL | Freq: Once | OROMUCOSAL | Status: AC

## 2020-05-14 MED ORDER — PROPOFOL 500 MG/50ML IV EMUL
INTRAVENOUS | Status: AC
  Filled 2020-05-14: qty 50

## 2020-05-14 MED ORDER — ONDANSETRON HCL 4 MG/2ML IJ SOLN: 4.0000 mg | Freq: Once | INTRAMUSCULAR | Status: DC | PRN

## 2020-05-14 MED ORDER — FENTANYL CITRATE (PF) 100 MCG/2ML IJ SOLN
INTRAMUSCULAR | Status: DC | PRN
  Administered 2020-05-14 (×2): 50 ug via INTRAVENOUS

## 2020-05-14 MED ORDER — FENTANYL CITRATE (PF) 100 MCG/2ML IJ SOLN: 25.0000 ug | INTRAMUSCULAR | Status: DC | PRN

## 2020-05-14 SURGICAL SUPPLY — 47 items
APL PRP STRL LF DISP 70% ISPRP (MISCELLANEOUS) ×1
APL SRG 38 LTWT LNG FL B (MISCELLANEOUS)
APPLICATOR ARISTA FLEXITIP XL (MISCELLANEOUS) IMPLANT
BLADE SURG SZ11 CARB STEEL (BLADE) ×2 IMPLANT
CATH FOLEY 2WAY  5CC 16FR (CATHETERS) ×1
CATH FOLEY 2WAY 5CC 16FR (CATHETERS) ×1
CATH ROBINSON RED A/P 16FR (CATHETERS) ×2 IMPLANT
CATH URTH 16FR FL 2W BLN LF (CATHETERS) ×1 IMPLANT
CHLORAPREP W/TINT 26 (MISCELLANEOUS) ×2 IMPLANT
COVER WAND RF STERILE (DRAPES) ×2 IMPLANT
DEFOGGER SCOPE WARMER CLEARIFY (MISCELLANEOUS) ×2 IMPLANT
DRSG TEGADERM 2-3/8X2-3/4 SM (GAUZE/BANDAGES/DRESSINGS) ×6 IMPLANT
GLOVE SURG SYN 8.0 (GLOVE) ×12 IMPLANT
GOWN STRL REUS W/ TWL LRG LVL3 (GOWN DISPOSABLE) ×3 IMPLANT
GOWN STRL REUS W/ TWL XL LVL3 (GOWN DISPOSABLE) ×1 IMPLANT
GOWN STRL REUS W/TWL LRG LVL3 (GOWN DISPOSABLE) ×6
GOWN STRL REUS W/TWL XL LVL3 (GOWN DISPOSABLE) ×2
GRASPER SUT TROCAR 14GX15 (MISCELLANEOUS) IMPLANT
HEMOSTAT ARISTA ABSORB 1G (HEMOSTASIS) IMPLANT
HEMOSTAT ARISTA ABSORB 3G PWDR (HEMOSTASIS) IMPLANT
IRRIGATION STRYKERFLOW (MISCELLANEOUS) IMPLANT
IRRIGATOR STRYKERFLOW (MISCELLANEOUS)
IV NS 1000ML (IV SOLUTION)
IV NS 1000ML BAXH (IV SOLUTION) IMPLANT
KIT PINK PAD W/HEAD ARE REST (MISCELLANEOUS) ×2
KIT PINK PAD W/HEAD ARM REST (MISCELLANEOUS) ×1 IMPLANT
KIT TURNOVER CYSTO (KITS) ×2 IMPLANT
LABEL OR SOLS (LABEL) ×2 IMPLANT
MANIFOLD NEPTUNE II (INSTRUMENTS) ×2 IMPLANT
NS IRRIG 500ML POUR BTL (IV SOLUTION) ×2 IMPLANT
PACK GYN LAPAROSCOPIC (MISCELLANEOUS) ×2 IMPLANT
PAD OB MATERNITY 4.3X12.25 (PERSONAL CARE ITEMS) ×2 IMPLANT
PAD PREP 24X41 OB/GYN DISP (PERSONAL CARE ITEMS) ×2 IMPLANT
SCISSORS METZENBAUM CVD 33 (INSTRUMENTS) IMPLANT
SET TUBE SMOKE EVAC HIGH FLOW (TUBING) ×2 IMPLANT
SHEARS HARMONIC ACE PLUS 36CM (ENDOMECHANICALS) ×2 IMPLANT
SLEEVE ENDOPATH XCEL 5M (ENDOMECHANICALS) ×4 IMPLANT
SPONGE GAUZE 2X2 8PLY STRL LF (GAUZE/BANDAGES/DRESSINGS) ×4 IMPLANT
STRIP CLOSURE SKIN 1/2X4 (GAUZE/BANDAGES/DRESSINGS) ×2 IMPLANT
SUT VIC AB 0 CT1 36 (SUTURE) ×2 IMPLANT
SUT VIC AB 2-0 UR6 27 (SUTURE) ×2 IMPLANT
SUT VIC AB 4-0 SH 27 (SUTURE) ×4
SUT VIC AB 4-0 SH 27XANBCTRL (SUTURE) ×2 IMPLANT
SWABSTK COMLB BENZOIN TINCTURE (MISCELLANEOUS) ×2 IMPLANT
TROCAR 5M 150ML BLDLS (TROCAR) ×2 IMPLANT
TROCAR ENDO BLADELESS 11MM (ENDOMECHANICALS) IMPLANT
TROCAR XCEL NON-BLD 5MMX100MML (ENDOMECHANICALS) ×2 IMPLANT

## 2020-05-14 NOTE — Progress Notes (Signed)
Pt scheduled for laparoscopy to evaluate right pelvic pain  LAbs reviewed . All questions answered . Proceed

## 2020-05-14 NOTE — Brief Op Note (Signed)
05/14/2020  11:37 AM  PATIENT:  Kristina Lane  30 y.o. female  PRE-OPERATIVE DIAGNOSIS:  right pelvic pain, dyspareunia   POST-OPERATIVE DIAGNOSIS:  right pelvic pain, dyspareunia , left side wall endometriosis  PROCEDURE:  Procedure(s): LAPAROSCOPY DIAGNOSTIC (N/A) LYSIS OF ADHESION (N/A)  Excision of left peritoneal endometriosis  SURGEON:  Surgeon(s) and Role:    * [name], [name], MD - Primary    * Ward, Honor Loh, MD - Assisting  PHYSICIAN ASSISTANT:   ASSISTANTS: none   ANESTHESIA:   general  EBL: ebl minimal, IOF 1000 , ou 100cc BLOOD ADMINISTERED:none  DRAINS: none   LOCAL MEDICATIONS USED:  LIDOCAINE   SPECIMEN:  Source of Specimen:  left side wall endometriosis  DISPOSITION OF SPECIMEN:  PATHOLOGY  COUNTS:  YES  TOURNIQUET:  * No tourniquets in log *  DICTATION: .Other Dictation: Dictation Number verbal  PLAN OF CARE: Discharge to home after PACU  PATIENT DISPOSITION:  PACU - hemodynamically stable.   Delay start of Pharmacological VTE agent (>24hrs) due to surgical blood loss or risk of bleeding: not applicable

## 2020-05-14 NOTE — Discharge Instructions (Signed)

## 2020-05-14 NOTE — Anesthesia Preprocedure Evaluation (Signed)
Anesthesia Evaluation  Patient identified by MRN, date of birth, ID band Patient awake    Reviewed: Allergy & Precautions, NPO status , Patient's Chart, lab work & pertinent test results  History of Anesthesia Complications (+) PONV and history of anesthetic complications  Airway Mallampati: III       Dental   Pulmonary asthma (no inhalers > 1 yr) , neg sleep apnea, neg COPD, Not current smoker,           Cardiovascular (-) hypertension(-) Past MI and (-) CHF (-) dysrhythmias (-) Valvular Problems/Murmurs     Neuro/Psych neg Seizures    GI/Hepatic Neg liver ROS, GERD  Medicated and Controlled,  Endo/Other  neg diabetesMorbid obesity  Renal/GU negative Renal ROS     Musculoskeletal   Abdominal   Peds  Hematology   Anesthesia Other Findings   Reproductive/Obstetrics                             Anesthesia Physical Anesthesia Plan  ASA: III  Anesthesia Plan: General   Post-op Pain Management:    Induction: Intravenous  PONV Risk Score and Plan: 4 or greater and Ondansetron, Dexamethasone, Propofol infusion and Midazolam  Airway Management Planned: Oral ETT  Additional Equipment:   Intra-op Plan:   Post-operative Plan:   Informed Consent: I have reviewed the patients History and Physical, chart, labs and discussed the procedure including the risks, benefits and alternatives for the proposed anesthesia with the patient or authorized representative who has indicated his/her understanding and acceptance.       Plan Discussed with:   Anesthesia Plan Comments:         Anesthesia Quick Evaluation

## 2020-05-14 NOTE — Anesthesia Postprocedure Evaluation (Signed)
Anesthesia Post Note  Patient: Kristina Lane  Procedure(s) Performed: LAPAROSCOPY DIAGNOSTIC (N/A ) LYSIS OF ADHESION (N/A )  Patient location during evaluation: PACU Anesthesia Type: General Level of consciousness: awake and alert Pain management: pain level controlled Vital Signs Assessment: post-procedure vital signs reviewed and stable Respiratory status: spontaneous breathing and respiratory function stable Cardiovascular status: stable Anesthetic complications: no   No complications documented.   Last Vitals:  Vitals:   05/14/20 1215 05/14/20 1230  BP: 128/76 135/84  Pulse: 88   Resp: 19 20  Temp:    SpO2: 100%     Last Pain:  Vitals:   05/14/20 1230  TempSrc:   PainSc: Asleep                 [name] K

## 2020-05-14 NOTE — Anesthesia Procedure Notes (Signed)
Procedure Name: Intubation Date/Time: 05/14/2020 10:20 AM Performed by: Babs Sciara, CRNA Pre-anesthesia Checklist: Suction available, Emergency Drugs available, Patient identified, Patient being monitored and Timeout performed Patient Re-evaluated:Patient Re-evaluated prior to induction Oxygen Delivery Method: Circle system utilized Preoxygenation: Pre-oxygenation with 100% oxygen Induction Type: IV induction Ventilation: Mask ventilation without difficulty Laryngoscope Size: Mac and 3 Grade View: Grade I Tube type: Oral Tube size: 7.0 mm Number of attempts: 2 Airway Equipment and Method: Stylet and Video-laryngoscopy Placement Confirmation: positive ETCO2 and breath sounds checked- equal and bilateral Secured at: 22 (lip) cm Dental Injury: Teeth and Oropharynx as per pre-operative assessment  Comments: Grade 1 view with 3 mac but unable to pass ETT through cords--esophageal, switched to [place] 3 intubated successfully. No dental or mucosal trauma

## 2020-05-14 NOTE — Transfer of Care (Signed)
Immediate Anesthesia Transfer of Care Note  Patient: Kristina Lane  Procedure(s) Performed: LAPAROSCOPY DIAGNOSTIC (N/A ) LYSIS OF ADHESION (N/A )  Patient Location: PACU  Anesthesia Type:General  Level of Consciousness: awake  Airway & Oxygen Therapy: Patient Spontanous Breathing and Patient connected to face mask oxygen  Post-op Assessment: Report given to RN and Post -op Vital signs reviewed and stable  Post vital signs: Reviewed and stable  Last Vitals:  Vitals Value Taken Time  BP 143/91   Temp    Pulse 90 05/14/20 1151  Resp 13 05/14/20 1150  SpO2 99 % 05/14/20 1151  Vitals shown include unvalidated device data.  Last Pain:  Vitals:   05/14/20 1145  TempSrc:   PainSc: 0-No pain         Complications: No complications documented.

## 2020-05-15 ENCOUNTER — Encounter: Payer: Self-pay | Admitting: Obstetrics and Gynecology

## 2020-05-16 NOTE — Op Note (Signed)
NAME: Kristina Lane, Kristina Lane MEDICAL RECORD QP:[id number] ACCOUNT [id number] DATE OF BIRTH:September 22, 1990 FACILITY: [facility] LOCATION: [facility] PHYSICIAN:Tyr Franca Josefine Class, MD  OPERATIVE REPORT  DATE OF PROCEDURE:  05/14/2020  PREOPERATIVE DIAGNOSES: 1.  Pelvic pain. 2.  Dyspareunia.  POSTOPERATIVE DIAGNOSES: 1.  Pelvic pain. 2.  Dyspareunia. 3.  Abdominopelvic adhesions. 4.  Endometriosis.  PROCEDURE: 1.  Laparoscopic pelvic and abdominal adhesiolysis. 2.  Excision of left pelvic sidewall endometriosis.  SURGEON:  Laverta Baltimore, MD  FIRST ASSISTANT:  Larey Days, MD  INDICATIONS:  A 30 year old female status post a laparoscopic-assisted vaginal hysterectomy in 09/2019.  The patient with a prior history of pelvic pain and continued with pelvic pain postoperatively in June.  The patient complains of dyspareunia as  well.  DESCRIPTION OF PROCEDURE:  After adequate general endotracheal anesthesia, the patient was placed in dorsal supine position with the legs in the [place] stirrups.  The patient's abdomen, perineum and vagina were prepped and draped in normal sterile  fashion.  Timeout was performed.  Straight catheterization of the bladder yielded 100 mL clear urine.  Gloves and gown were changed.  Attention was then directed to the patient's abdomen where a 5 mm infraumbilical incision was made after injecting with  Marcaine.  Several attempts were used to try to gain entrance into the peritoneal cavity through this site which were unsuccessful.  An incision was made at Palmer's point midclavicular, 3 cm inferior to the inferior rib on the left.  The patient had a  nasogastric tube placed prior to this.  A 5 mm trocar with the Optiview cannula then was advanced into the abdominal cavity.  The patient's abdomen was insufflated.  There was dense omental adhesion to the anterior abdominal wall.  A second port  placement was placed in the left lower quadrant 3 cm medial to the  left anterior iliac spine and a third port placement was placed in the right lower quadrant, again 3 cm medial to the right anterior iliac spine.  A Harmonic scalpel was brought up and a  broad band omental adhesion mid abdominal was taken down with her Harmonic scalpel.  Each ovary appeared free without adhesions.  Pictures were taken.  There were some filmy adhesions to the vaginal vault from the rectum and epiploica.  These were taken  down with the Harmonic scalpel.  An EEA sizer 25 mm was then advanced rectally to give guidance to these adhesions.  The patient already had a sponge stick in the vagina.  No additional adhesions were noted and the bowel was not contiguous with the  vaginal cuff.  Gloves and gowns were changed again.  There was a small hemosiderin area, approximately 3 mm noted on the left pelvic sidewall close to the external iliac vasculature.  This was picked up and tented away from the vasculature and dissected  free.  This looked consistent with endometriosis.  Good hemostasis was noted.  The patient's abdomen was deflated and all 4 incisions were closed with interrupted 4-0 Vicryl suture.  Sterile dressing applied.  The EEA sizer had been previously removed  and the vaginal sponge stick was removed.  COMPLICATIONS:  There were no complications.  ESTIMATED BLOOD LOSS:  Minimal.  INTRAOPERATIVE FLUIDS:  1 liter.  URINE OUTPUT:  100 mL.  The patient did receive 30 mg intravascular Toradol.  DISPOSITION:  The patient was taken to recovery room in good condition.  HN/NUANCE  D:05/14/2020 T:05/14/2020 JOB:[id number]

## 2020-05-17 LAB — SURGICAL PATHOLOGY

## 2020-08-19 ENCOUNTER — Other Ambulatory Visit: Payer: Self-pay | Admitting: Gastroenterology

## 2020-08-19 ENCOUNTER — Other Ambulatory Visit (HOSPITAL_COMMUNITY): Payer: Self-pay | Admitting: Gastroenterology

## 2020-08-19 DIAGNOSIS — R1013 Epigastric pain: Secondary | ICD-10-CM

## 2020-08-24 ENCOUNTER — Ambulatory Visit (HOSPITAL_COMMUNITY): Payer: Medicaid Other

## 2020-08-24 ENCOUNTER — Encounter (HOSPITAL_COMMUNITY): Payer: Self-pay

## 2020-12-06 ENCOUNTER — Other Ambulatory Visit: Payer: Self-pay

## 2020-12-06 ENCOUNTER — Encounter: Payer: Self-pay | Admitting: Emergency Medicine

## 2020-12-06 ENCOUNTER — Emergency Department
Admission: EM | Admit: 2020-12-06 | Discharge: 2020-12-06 | Disposition: A | Discharge status: Home / Self Care | Payer: Medicaid Other | Attending: Emergency Medicine | Admitting: Emergency Medicine

## 2020-12-06 DIAGNOSIS — R109 Unspecified abdominal pain: Secondary | ICD-10-CM | POA: Diagnosis not present

## 2020-12-06 DIAGNOSIS — Z28310 Unvaccinated for covid-19: Secondary | ICD-10-CM | POA: Insufficient documentation

## 2020-12-06 DIAGNOSIS — R059 Cough, unspecified: Secondary | ICD-10-CM | POA: Diagnosis present

## 2020-12-06 DIAGNOSIS — K219 Gastro-esophageal reflux disease without esophagitis: Secondary | ICD-10-CM | POA: Diagnosis not present

## 2020-12-06 DIAGNOSIS — U071 COVID-19: Secondary | ICD-10-CM | POA: Diagnosis not present

## 2020-12-06 DIAGNOSIS — J45909 Unspecified asthma, uncomplicated: Secondary | ICD-10-CM | POA: Insufficient documentation

## 2020-12-06 DIAGNOSIS — H9203 Otalgia, bilateral: Secondary | ICD-10-CM | POA: Diagnosis not present

## 2020-12-06 DIAGNOSIS — R3915 Urgency of urination: Secondary | ICD-10-CM | POA: Diagnosis not present

## 2020-12-06 LAB — RESP PANEL BY RT-PCR (FLU A&B, COVID) ARPGX2
Influenza A by PCR: NEGATIVE
Influenza B by PCR: NEGATIVE
SARS Coronavirus 2 by RT PCR: POSITIVE — AB

## 2020-12-06 LAB — URINALYSIS, COMPLETE (UACMP) WITH MICROSCOPIC
Bilirubin Urine: NEGATIVE
Glucose, UA: NEGATIVE mg/dL
Leukocytes,Ua: NEGATIVE
Nitrite: NEGATIVE
Protein, ur: NEGATIVE mg/dL
Specific Gravity, Urine: 1.025 (ref 1.005–1.030)
pH: 5.5 (ref 5.0–8.0)

## 2020-12-06 MED ORDER — NIRMATRELVIR/RITONAVIR (PAXLOVID)TABLET: 3.0000 | ORAL_TABLET | Freq: Two times a day (BID) | ORAL | 0 refills | Status: AC

## 2020-12-06 NOTE — ED Provider Notes (Signed)
[facility] Emergency Department Provider Note ____________________________________________  Time seen: 1558  I have reviewed the triage vital signs and the nursing notes.  HISTORY  Chief Complaint  Generalized Body Aches, Cough, and Otalgia   HPI Kristina Lane is a 30 y.o. female with the below medical history, presents to the ED with 2 to 3 days of generalized body aches, flank pain, and mild cough.  Patient denies any frank fevers but does note chills as well as some bilateral otalgia.  She has also noticed some urinary urgency but denies any dysuria or hematuria.  She denies been exposed to [place].  she has not had vaccines for COVID and/or flu.  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)    Migraines    PONV (postoperative nausea and vomiting)     Patient Active Problem List   Diagnosis Date Noted   Pelvic pain in female 02/19/2020   Headache disorder 10/09/2019   Nerve pain 10/09/2019   Photophobia 10/09/2019   Weakness 10/09/2019   GERD (gastroesophageal reflux disease) 07/10/2019   Localized edema 07/10/2019   Mild intermittent asthma without complication [id number]   Obesity, Class III, BMI 40-49.9 (morbid obesity) ([place]) 07/10/2019   Chronic pain of both wrists 04/14/2019   Chronic pain of lower extremity, bilateral 04/14/2019   Pain syndrome, chronic 04/14/2019    Past Surgical History:  Procedure Laterality Date   BREAST REDUCTION SURGERY Bilateral 10/28/2019   Procedure: BILATERAL MAMMARY REDUCTION  (BREAST);  Surgeon: [name], [name], MD;  Location: [place];  Service: Plastics;  Laterality: Bilateral;   CESAREAN SECTION  2014   ESOPHAGOGASTRODUODENOSCOPY N/A 03/08/2020   Procedure: ESOPHAGOGASTRODUODENOSCOPY (EGD);  Surgeon: [name], MD;  Location:  [facility] ENDOSCOPY;  Service: Endoscopy;  Laterality: N/A;   HERNIA REPAIR  [id number]   UMBILICAL   INSERTION OF MESH  2018   TO REPLACE INFECTED MESH   LAPAROSCOPIC VAGINAL HYSTERECTOMY WITH SALPINGECTOMY Bilateral 09/22/2019   Procedure: LAPAROSCOPIC ASSISTED VAGINAL HYSTERECTOMY WITH SALPINGECTOMY;  Surgeon: [name], [name], MD;  Location: [facility];  Service: Gynecology;  Laterality: Bilateral;   LAPAROSCOPY N/A 05/14/2020   Procedure: LAPAROSCOPY DIAGNOSTIC;  Surgeon: Ouida Sills [name], MD;  Location: [facility];  Service: Gynecology;  Laterality: N/A;   LYSIS OF ADHESION N/A 05/14/2020   Procedure: LYSIS OF ADHESION;  Surgeon: [name], [name], MD;  Location: [facility];  Service: Gynecology;  Laterality: N/A;    Prior to Admission medications   Medication Sig Start Date End Date Taking? Authorizing Provider  nirmatrelvir/ritonavir EUA (PAXLOVID) 20 x 150 MG & 10 x '100MG'$  TABS Take 3 tablets by mouth 2 (two) times daily for 5 days. Patient GFR is WNL. Take nirmatrelvir (150 mg) two tablets twice daily for 5 days and ritonavir (100 mg) one tablet twice daily for 5 days. 12/06/20 12/11/20 Yes Naija Troost, Dannielle Karvonen, PA-C  nortriptyline (PAMELOR) 10 MG capsule Take 10 mg by mouth 2 (two) times daily. 02/14/20   [provider]  pantoprazole (PROTONIX) 40 MG tablet Take 40 mg by mouth 2 (two) times daily before a meal.    [provider]    Allergies Ciprofloxacin, Dilaudid [hydromorphone hcl], and Penicillins  Family History  Problem Relation Age of Onset   Breast cancer Paternal 38  Breast cancer Maternal Grandmother    Hypertension Mother    Kidney cancer Father    Hypertension Father     Social History Social History   Tobacco Use   Smoking status: Never   Smokeless tobacco: Never  Substance Use Topics   Alcohol use: Yes    Comment: occassional   Drug use: Never    Review of Systems  Constitutional: Negative for fever.  Reports generalized  malaise and chills Eyes: Negative for visual changes. ENT: Negative for sore throat. Cardiovascular: Negative for chest pain. Respiratory: Negative for shortness of breath. Gastrointestinal: Negative for abdominal pain, vomiting and diarrhea. Genitourinary: Negative for dysuria. Musculoskeletal: Negative for back pain.  Notes generalized body aches Skin: Negative for rash. Neurological: Negative for headaches, focal weakness or numbness. ____________________________________________  PHYSICAL EXAM:  VITAL SIGNS: ED Triage Vitals  Enc Vitals Group     BP 12/06/20 1516 (!) 156/98     Pulse Rate 12/06/20 1516 (!) 110     Resp 12/06/20 1516 18     Temp 12/06/20 1516 98.9 F (37.2 C)     Temp Source 12/06/20 1516 Oral     SpO2 12/06/20 1516 99 %     Weight 12/06/20 1517 220 lb (99.8 kg)     Height 12/06/20 1517 5' (1.524 m)     Head Circumference --      Peak Flow --      Pain Score 12/06/20 1517 8     Pain Loc --      Pain Edu? --      Excl. in [place]? --     Constitutional: Alert and oriented. Well appearing and in no distress. Head: Normocephalic and atraumatic. Eyes: Conjunctivae are normal. PERRL. Normal extraocular movements Ears: Canals clear. TMs intact bilaterally. Nose: No congestion/rhinorrhea/epistaxis. Mouth/Throat: Mucous membranes are moist. Neck: Supple. No thyromegaly. Hematological/Lymphatic/Immunological: No cervical lymphadenopathy. Cardiovascular: Normal rate, regular rhythm. Normal distal pulses. Respiratory: Normal respiratory effort. No wheezes/rales/rhonchi. Gastrointestinal: Soft and nontender. No distention. Musculoskeletal: Nontender with normal range of motion in all extremities.  Neurologic:  Normal gait without ataxia. Normal speech and language. No gross focal neurologic deficits are appreciated. Skin:  Skin is warm, dry and intact. No rash noted. Psychiatric: Mood and affect are normal. Patient exhibits appropriate insight and  judgment. ____________________________________________    {LABS (pertinent positives/negatives)  Labs Reviewed  RESP PANEL BY RT-PCR (FLU A&B, COVID) ARPGX2 - Abnormal; Notable for the following components:      Result Value   SARS Coronavirus 2 by RT PCR POSITIVE (*)    All other components within normal limits  URINALYSIS, COMPLETE (UACMP) WITH MICROSCOPIC - Abnormal; Notable for the following components:   Hgb urine dipstick SMALL (*)    Ketones, ur TRACE (*)    Bacteria, UA RARE (*)    All other components within normal limits   ____________________________________________  {EKG  ____________________________________________   RADIOLOGY Official radiology report(s): No results found. ____________________________________________  PROCEDURES   Procedures ____________________________________________   INITIAL IMPRESSION / ASSESSMENT AND PLAN / ED COURSE  As part of my medical decision making, I reviewed the following data within the [place] reviewed as noted and Notes from prior ED visits    DDX: Covid, influenza, UTI  Patient ED evaluation of symptoms including chills, malaise, and body aches.  Patient is evaluated for complaints in the ED, found to have a positive coronavirus PCR.  Urinalysis is negative for any acute infectious process.  Patient is stable  otherwise without signs of acute respiratory stress or dehydration.  She is inclined to take Paxlovid for symptom management, will be discharged with a prescription for the same.  She will follow with primary provider return to the ED if necessary.  Kristina Lane was evaluated in Emergency Department on 12/06/2020 for the symptoms described in the history of present illness. She was evaluated in the context of the global COVID-19 pandemic, which necessitated consideration that the patient might be at risk for infection with the SARS-CoV-2 virus that causes COVID-19. Institutional protocols  and algorithms that pertain to the evaluation of patients at risk for COVID-19 are in a state of rapid change based on information released by regulatory bodies including the CDC and federal and state organizations. These policies and algorithms were followed during the patient's care in the ED. ____________________________________________  FINAL CLINICAL IMPRESSION(S) / ED DIAGNOSES  Final diagnoses:  COVID-19      Melvenia Needles, PA-C 12/06/20 1659    Vladimir Crofts, MD 12/06/20 2340

## 2020-12-06 NOTE — Discharge Instructions (Addendum)
Take the prescription Paxlovid medication as prescribed.  Take over-the-counter medications to manage fevers and other symptoms as necessary.  Follow-up with primary provider or return to the ED if needed for worsening symptoms.

## 2020-12-06 NOTE — ED Triage Notes (Addendum)
Pt via POV from home. Pt c/o generalized body aches, chills, bilateral otalgia and cough for the past two days. Denies being exposed to COVID 19. Pt is A&Ox4 and NAD.

## 2021-01-08 IMAGING — CT CT HEAD W/O CM
3 series · 15 of 45 positions shown, 18 images · non-contrast
Comparison: None.

CLINICAL DATA: Vertigo

EXAM:
CT HEAD WITHOUT CONTRAST
TECHNIQUE: Contiguous axial images were obtained from the base of the skull
through the vertex without intravenous contrast.

[Series 2: head wo · axial · 0.41mm/px · z∈[+352,+467]mm · 9 of 28 slices shown, 12 images]
[im 3/28  brain]
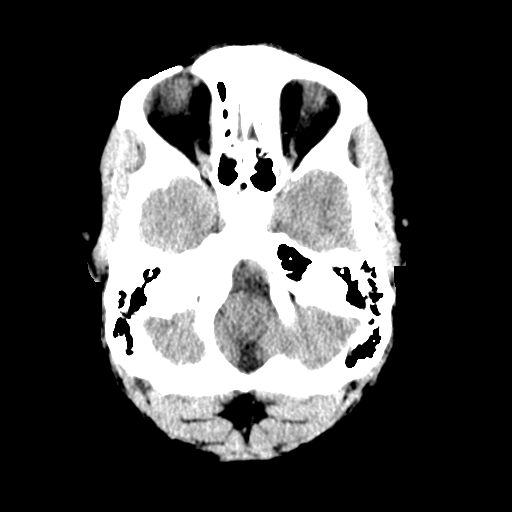
[im 3/28  bone]
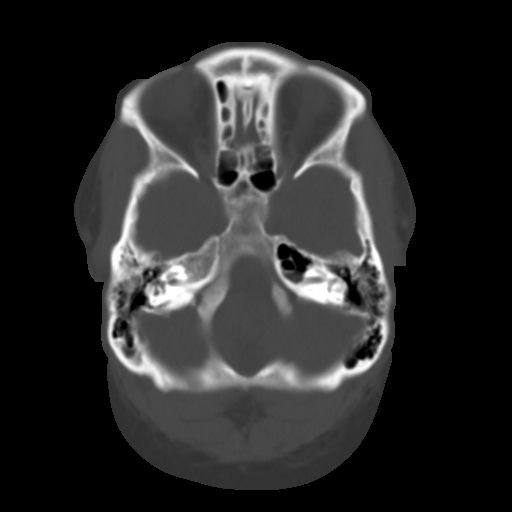
[im 6/28  brain]
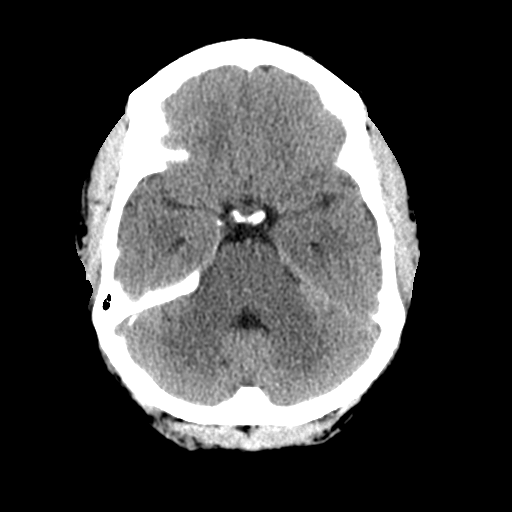
[im 9/28  brain]
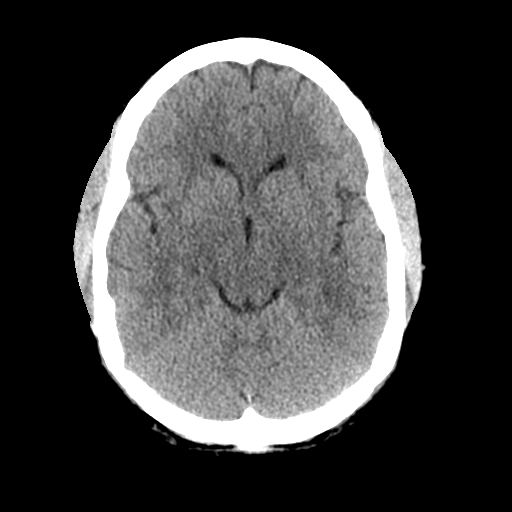
[im 12/28  brain]
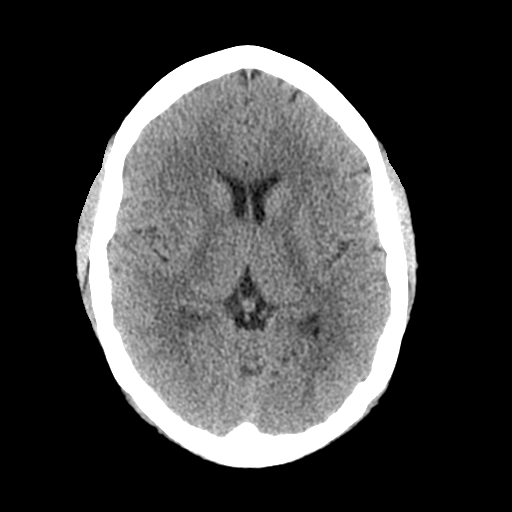
[im 15/28  brain]
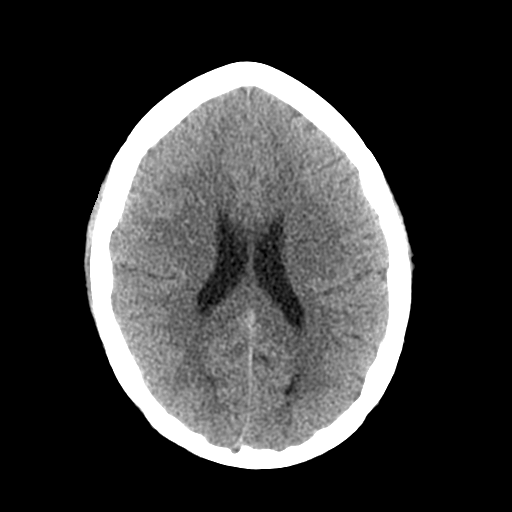
[im 15/28  bone]
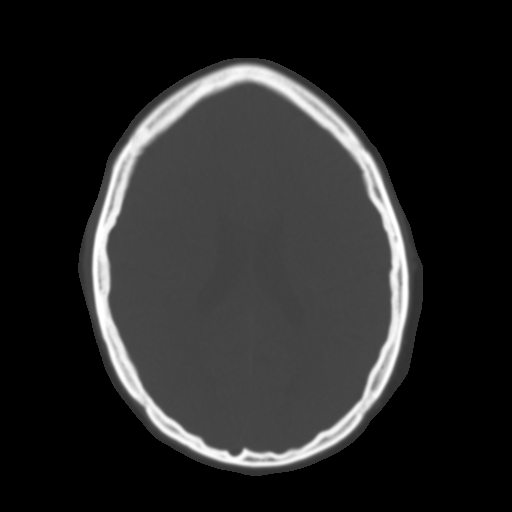
[im 17/28  brain]
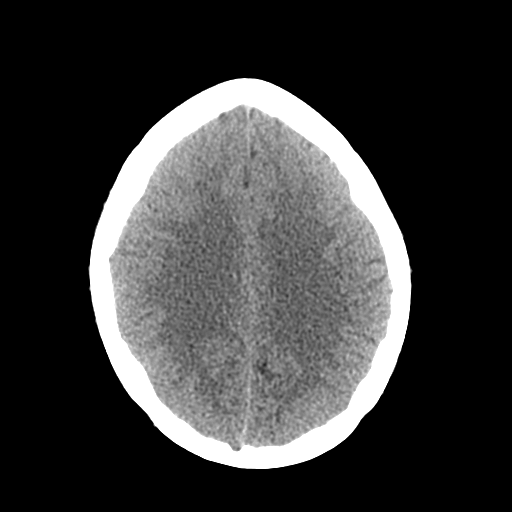
[im 20/28  brain]
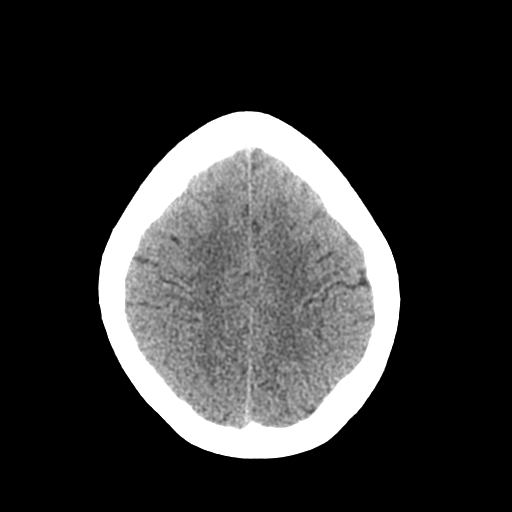
[im 23/28  brain]
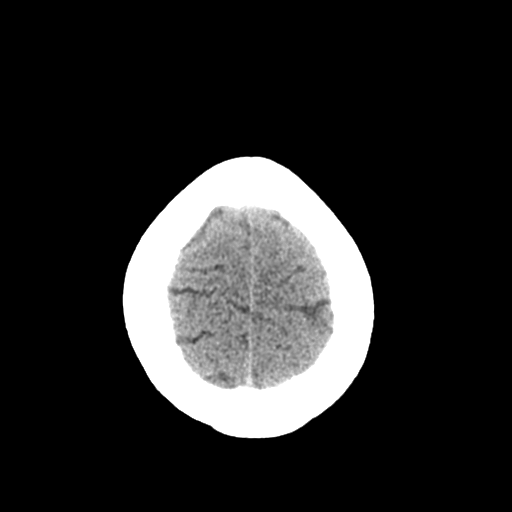
[im 26/28  brain]
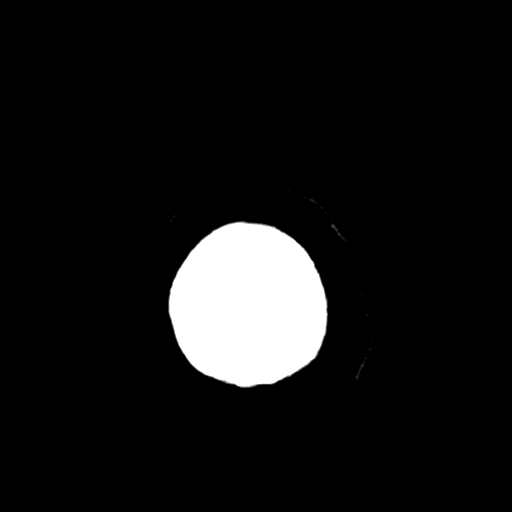
[im 26/28  bone]
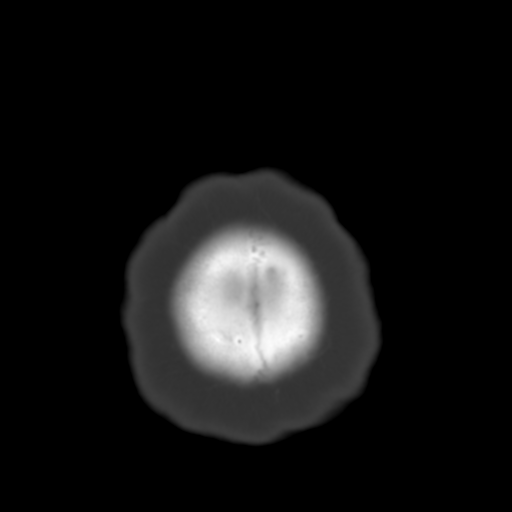

[Series 4: coronal soft tissue · coronal · 0.29mm/px · 3 of 63 slices shown]
[im 21/63  brain]
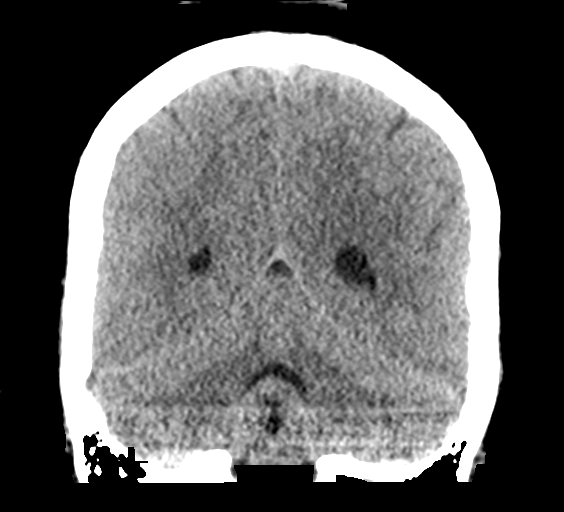
[im 28/63  brain]
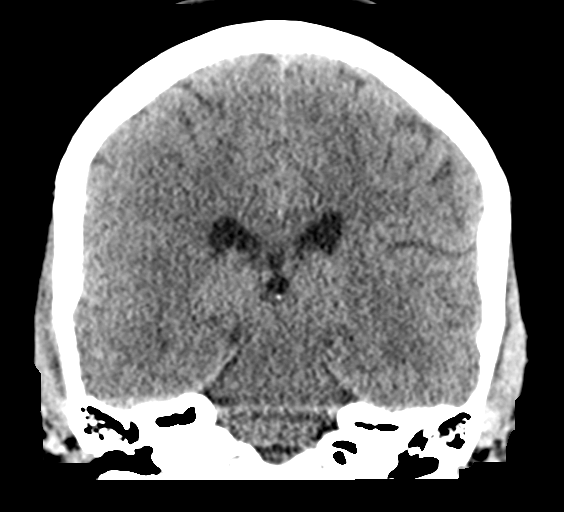
[im 35/63  brain]
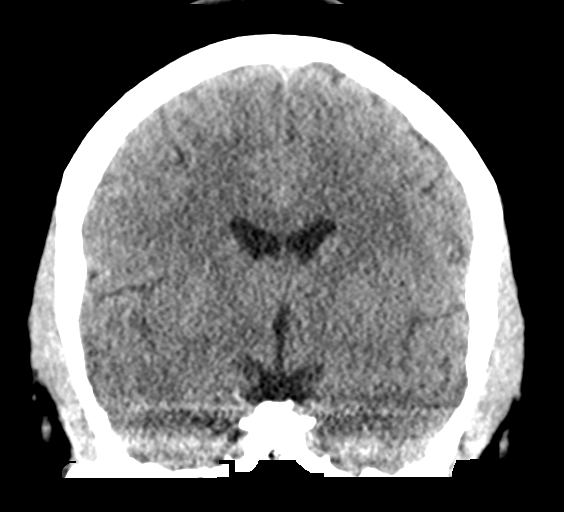

[Series 5: sagittal soft tissue · sagittal · 0.29mm/px · 3 of 52 slices shown]
[im 18/52  brain]
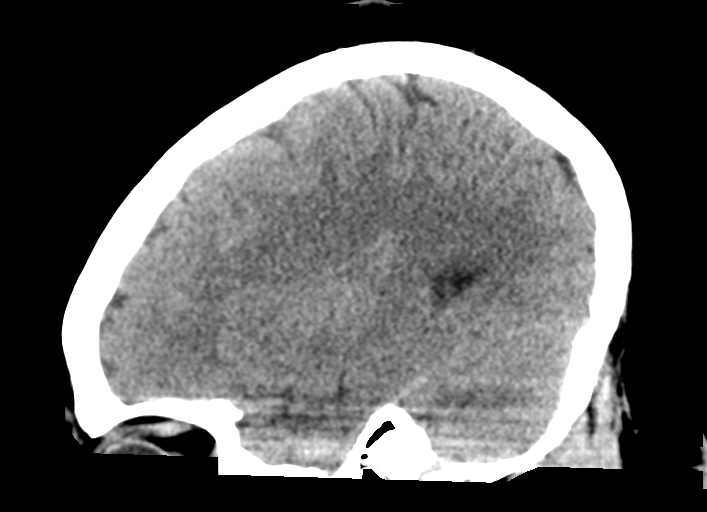
[im 26/52  brain]
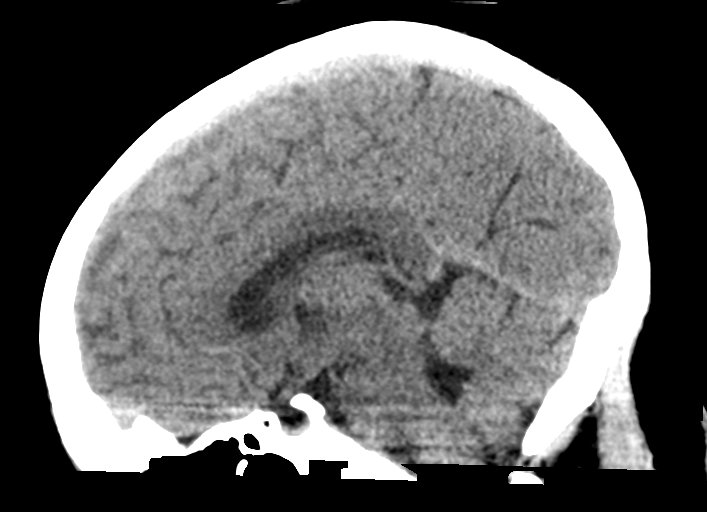
[im 35/52  brain]
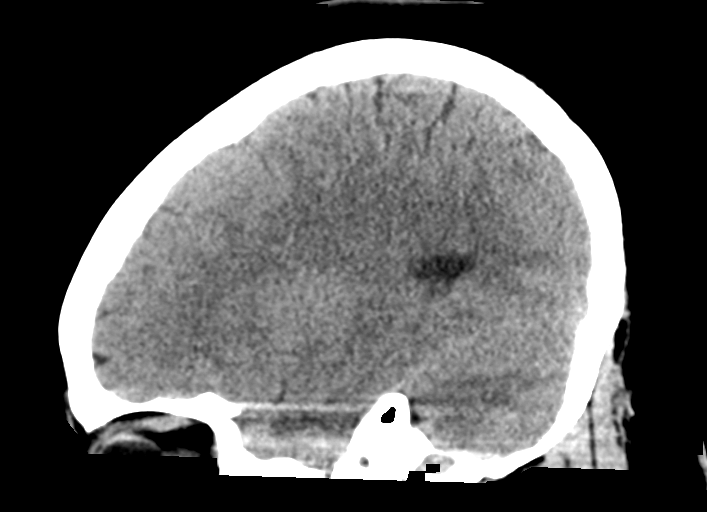

[15 of 45 positions shown; findings below may reference images not displayed]

FINDINGS: Brain: There is no acute intracranial hemorrhage, mass effect, or
edema. Gray-white differentiation is preserved. There is no
extra-axial fluid collection. Ventricles and sulci are within normal
limits in size and configuration.

Vascular: No hyperdense vessel or unexpected calcification.

Skull: Calvarium is unremarkable.

Sinuses/Orbits: No acute finding.

Other: Mastoid air cells are clear.
IMPRESSION: No acute intracranial abnormality.
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
## 2021-09-30 DIAGNOSIS — R102 Pelvic and perineal pain: Secondary | ICD-10-CM | POA: Diagnosis not present

## 2021-09-30 DIAGNOSIS — R350 Frequency of micturition: Secondary | ICD-10-CM | POA: Diagnosis not present

## 2021-09-30 DIAGNOSIS — N3281 Overactive bladder: Secondary | ICD-10-CM | POA: Diagnosis not present

## 2022-02-07 ENCOUNTER — Ambulatory Visit
Admission: EM | Admit: 2022-02-07 | Discharge: 2022-02-07 | Disposition: A | Discharge status: Home / Self Care | Payer: Medicaid Other

## 2022-02-07 DIAGNOSIS — M5442 Lumbago with sciatica, left side: Secondary | ICD-10-CM

## 2022-02-07 DIAGNOSIS — N83201 Unspecified ovarian cyst, right side: Secondary | ICD-10-CM | POA: Insufficient documentation

## 2022-02-07 DIAGNOSIS — M25552 Pain in left hip: Secondary | ICD-10-CM | POA: Diagnosis not present

## 2022-02-07 MED ORDER — PREDNISONE 20 MG PO TABS: 60.0000 mg | ORAL_TABLET | Freq: Every day | ORAL | 0 refills | Status: AC

## 2022-02-07 MED ORDER — KETOROLAC TROMETHAMINE 30 MG/ML IJ SOLN
30.0000 mg | Freq: Once | INTRAMUSCULAR | Status: AC
  Administered 2022-02-07: 30 mg via INTRAMUSCULAR

## 2022-02-07 NOTE — ED Provider Notes (Signed)
[name]    CSN: [id number] Arrival date & time: 02/07/22  1746      History   Chief Complaint Chief Complaint  Patient presents with   Back Pain   Hip Pain    HPI Kristina Lane is a 31 y.o. female.    Back Pain Hip Pain    This to UC with complaint of new left hip and lower back pain.  Symptoms started about 6 days ago.  She denies any history of hip pain.  Previous history of lower back pain "many years ago".  She says she was evaluated in orthopedics and was told she has "curved spine" that she confirms is scoliosis.  She does report routine lower back pain that feels "nothing like this".  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)    Migraines    PONV (postoperative nausea and vomiting)     Patient Active Problem List   Diagnosis Date Noted   Right ovarian cyst 02/07/2022   Pelvic pain in female 02/19/2020   Left sided numbness 02/19/2020   Tinnitus of both ears 02/19/2020   Headache disorder 10/09/2019   Nerve pain 10/09/2019   Photophobia 10/09/2019   Weakness 10/09/2019   GERD (gastroesophageal reflux disease) 07/10/2019   Localized edema 07/10/2019   Mild intermittent asthma without complication 43/32/9518   Obesity, Class III, BMI 40-49.9 (morbid obesity) ([place]) 07/10/2019   Chronic pain of both wrists 04/14/2019   Chronic pain of lower extremity, bilateral 04/14/2019   Pain syndrome, chronic 04/14/2019    Past Surgical History:  Procedure Laterality Date   BREAST REDUCTION SURGERY Bilateral 10/28/2019   Procedure: BILATERAL MAMMARY REDUCTION  (BREAST);  Surgeon: [name], [name], MD;  Location: [place];  Service: Plastics;  Laterality: Bilateral;   CESAREAN SECTION  2014   ESOPHAGOGASTRODUODENOSCOPY N/A 03/08/2020   Procedure: ESOPHAGOGASTRODUODENOSCOPY (EGD);   Surgeon: [name], MD;  [facility] ENDOSCOPY;  Service: Endoscopy;  Laterality: N/A;   HERNIA REPAIR  8416   UMBILICAL   INSERTION OF MESH  2018   TO REPLACE INFECTED MESH   LAPAROSCOPIC VAGINAL HYSTERECTOMY WITH SALPINGECTOMY Bilateral 09/22/2019   Procedure: LAPAROSCOPIC ASSISTED VAGINAL HYSTERECTOMY WITH SALPINGECTOMY;  Surgeon: [name], [name], MD;  Location: [facility];  Service: Gynecology;  Laterality: Bilateral;   LAPAROSCOPY N/A 05/14/2020   Procedure: LAPAROSCOPY DIAGNOSTIC;  Surgeon: Ouida Sills [name], MD;  Location: [facility];  Service: Gynecology;  Laterality: N/A;   LYSIS OF ADHESION N/A 05/14/2020   Procedure: LYSIS OF ADHESION;  Surgeon: [name], [name], MD;  Location: [facility];  Service: Gynecology;  Laterality: N/A;    OB History   No obstetric history on file.      Home Medications    Prior to Admission medications   Medication Sig Start Date End Date Taking? Authorizing Provider  azithromycin (ZITHROMAX) 500 MG tablet Take by mouth. 07/17/19  Yes [provider]  dexlansoprazole (DEXILANT) 60 MG capsule Take by mouth. 08/19/20  Yes [provider]  gabapentin (NEURONTIN) 300 MG capsule Take by mouth. 04/29/20  Yes [provider]  HYDROcodone-acetaminophen (NORCO/VICODIN) 5-325 MG tablet Take by mouth. 04/29/20  Yes [provider]  ibuprofen (ADVIL) 800 MG tablet Take by mouth. 04/29/20  Yes [provider]  mirabegron ER (MYRBETRIQ)  25 MG TB24 tablet Take 1 tablet by mouth daily. 02/23/20  Yes [provider]  ondansetron (ZOFRAN) 8 MG tablet Take by mouth. 04/29/20  Yes [provider]  polyethylene glycol powder (GLYCOLAX/MIRALAX) 17 GM/SCOOP powder Mix in 4-8ounces of fluid prior to taking, take twice a day 03/18/20  Yes [provider]  solifenacin (VESICARE) 5 MG tablet Take by mouth. 09/30/21  Yes [provider]  tolterodine (DETROL LA) 2 MG 24 hr capsule  Take by mouth. 03/18/20  Yes [provider]  meclizine (ANTIVERT) 25 MG tablet Take by mouth.    [provider]  Multiple Vitamins-Minerals (ONE-A-DAY VITACRAVES) CHEW Chew by mouth.    [provider]  nortriptyline (PAMELOR) 10 MG capsule Take 10 mg by mouth 2 (two) times daily. 02/14/20   [provider]  pantoprazole (PROTONIX) 40 MG tablet Take 40 mg by mouth 2 (two) times daily before a meal.    [provider]    Family History Family History  Problem Relation Age of Onset   Breast cancer Paternal 7    Breast cancer Maternal Grandmother    Hypertension Mother    Kidney cancer Father    Hypertension Father     Social History Social History   Tobacco Use   Smoking status: Never   Smokeless tobacco: Never  Substance Use Topics   Alcohol use: Yes    Comment: occassional   Drug use: Never     Allergies   Ciprofloxacin, Dilaudid [hydromorphone hcl], and Penicillins   Review of Systems Review of Systems  Musculoskeletal:  Positive for back pain.     Physical Exam Triage Vital Signs ED Triage Vitals  Enc Vitals Group     BP 02/07/22 1819 (!) 144/93     Pulse Rate 02/07/22 1819 88     Resp 02/07/22 1819 18     Temp 02/07/22 1819 99 F (37.2 C)     Temp Source 02/07/22 1819 Oral     SpO2 02/07/22 1819 98 %     Weight --      Height --      Head Circumference --      Peak Flow --      Pain Score 02/07/22 1827 10     Pain Loc --      Pain Edu? --      Excl. in [place]? --    No data found.  Updated Vital Signs BP (!) 144/93 (BP Location: Left Arm)   Pulse 88   Temp 99 F (37.2 C) (Oral)   Resp 18   LMP 08/14/2019   SpO2 98%   Visual Acuity Right Eye Distance:   Left Eye Distance:   Bilateral Distance:    Right Eye Near:   Left Eye Near:    Bilateral Near:     Physical Exam Musculoskeletal:     Lumbar back: Tenderness and bony tenderness present. Positive left straight leg raise test. Negative  right straight leg raise test.     Left hip: Tenderness present. Decreased range of motion.      UC Treatments / Results  Labs (all labs ordered are listed, but only abnormal results are displayed) Labs Reviewed - No data to display  EKG   Radiology No results found.  Procedures Procedures (including critical care time)  Medications Ordered in UC Medications - No data to display  Initial Impression / Assessment and Plan / UC Course  I have reviewed the triage vital signs  and the nursing notes.  Pertinent labs & imaging results that were available during my care of the patient were reviewed by me and considered in my medical decision making (see chart for details).   Patient with reported 6 days of left hip and lower back pain.  Unknown etiology, history, and denies any past or recent injury.  Presume inflammatory condition and offered treatment with anti-inflammatory medication.  We will treat with Toradol in clinic and send patient home with prescription for prednisone.  Encourage patient to follow-up with orthopedic provider if symptoms do not respond to treatment or recur.   Final Clinical Impressions(s) / UC Diagnoses   Final diagnoses:  None   Discharge Instructions   None    ED Prescriptions   None    PDMP not reviewed this encounter.   Rose Phi,  02/07/22 1902

## 2022-02-07 NOTE — Discharge Instructions (Addendum)
Follow-up by scheduling appointment with orthopedics for evaluation of your symptoms if they recur or do not respond to treatment.  Follow-up with your primary care provider as needed.

## 2022-02-07 NOTE — ED Triage Notes (Signed)
Pt. Presents to UC w/ c/o left lower back and hip pain that started 6 days ago.

## 2022-05-05 DIAGNOSIS — Z8673 Personal history of transient ischemic attack (TIA), and cerebral infarction without residual deficits: Secondary | ICD-10-CM | POA: Diagnosis not present

## 2022-05-05 DIAGNOSIS — G894 Chronic pain syndrome: Secondary | ICD-10-CM | POA: Diagnosis not present

## 2022-05-05 DIAGNOSIS — Z885 Allergy status to narcotic agent status: Secondary | ICD-10-CM | POA: Diagnosis not present

## 2022-05-05 DIAGNOSIS — M79662 Pain in left lower leg: Secondary | ICD-10-CM | POA: Diagnosis not present

## 2022-05-05 DIAGNOSIS — D259 Leiomyoma of uterus, unspecified: Secondary | ICD-10-CM | POA: Diagnosis not present

## 2022-05-05 DIAGNOSIS — Z880 Allergy status to penicillin: Secondary | ICD-10-CM | POA: Diagnosis not present

## 2022-05-05 DIAGNOSIS — M79672 Pain in left foot: Secondary | ICD-10-CM | POA: Diagnosis not present

## 2022-05-05 DIAGNOSIS — Z881 Allergy status to other antibiotic agents status: Secondary | ICD-10-CM | POA: Diagnosis not present

## 2022-05-05 DIAGNOSIS — K219 Gastro-esophageal reflux disease without esophagitis: Secondary | ICD-10-CM | POA: Diagnosis not present

## 2022-05-05 DIAGNOSIS — M79605 Pain in left leg: Secondary | ICD-10-CM | POA: Diagnosis not present

## 2022-05-05 DIAGNOSIS — K449 Diaphragmatic hernia without obstruction or gangrene: Secondary | ICD-10-CM | POA: Diagnosis not present

## 2022-08-25 DIAGNOSIS — N760 Acute vaginitis: Secondary | ICD-10-CM | POA: Diagnosis not present

## 2022-08-25 DIAGNOSIS — Z3162 Encounter for fertility preservation counseling: Secondary | ICD-10-CM | POA: Diagnosis not present

## 2022-08-25 DIAGNOSIS — R300 Dysuria: Secondary | ICD-10-CM | POA: Diagnosis not present

## 2022-08-25 DIAGNOSIS — N3000 Acute cystitis without hematuria: Secondary | ICD-10-CM | POA: Diagnosis not present

## 2022-10-04 DIAGNOSIS — B353 Tinea pedis: Secondary | ICD-10-CM | POA: Diagnosis not present

## 2022-10-19 DIAGNOSIS — N3010 Interstitial cystitis (chronic) without hematuria: Secondary | ICD-10-CM | POA: Diagnosis not present

## 2022-10-19 DIAGNOSIS — R102 Pelvic and perineal pain: Secondary | ICD-10-CM | POA: Diagnosis not present

## 2022-10-19 DIAGNOSIS — Z3141 Encounter for fertility testing: Secondary | ICD-10-CM | POA: Diagnosis not present

## 2023-01-01 ENCOUNTER — Ambulatory Visit: Payer: Medicaid Other | Admitting: Urology

## 2023-01-02 ENCOUNTER — Encounter: Payer: Self-pay | Admitting: Urology

## 2023-01-15 ENCOUNTER — Emergency Department: Payer: Medicaid Other

## 2023-01-15 ENCOUNTER — Encounter: Payer: Self-pay | Admitting: *Deleted

## 2023-01-15 ENCOUNTER — Emergency Department
Admission: EM | Admit: 2023-01-15 | Discharge: 2023-01-16 | Disposition: A | Discharge status: Home / Self Care | Payer: Medicaid Other | Attending: Emergency Medicine | Admitting: Emergency Medicine

## 2023-01-15 ENCOUNTER — Other Ambulatory Visit: Payer: Self-pay

## 2023-01-15 DIAGNOSIS — M7989 Other specified soft tissue disorders: Secondary | ICD-10-CM | POA: Insufficient documentation

## 2023-01-15 DIAGNOSIS — M79672 Pain in left foot: Secondary | ICD-10-CM | POA: Insufficient documentation

## 2023-01-15 DIAGNOSIS — J4520 Mild intermittent asthma, uncomplicated: Secondary | ICD-10-CM | POA: Diagnosis not present

## 2023-01-15 DIAGNOSIS — M79673 Pain in unspecified foot: Secondary | ICD-10-CM | POA: Diagnosis not present

## 2023-01-15 NOTE — ED Triage Notes (Signed)
Pt brought in via ems from home.  Pt has left foot pain.  No known injury.  Pt reports pain when ambulating.  Pt alert.

## 2023-01-16 DIAGNOSIS — M79672 Pain in left foot: Secondary | ICD-10-CM | POA: Diagnosis not present

## 2023-01-16 MED ORDER — OXYCODONE-ACETAMINOPHEN 5-325 MG PO TABS
1.0000 | ORAL_TABLET | Freq: Once | ORAL | Status: AC
  Administered 2023-01-16: 1 via ORAL
  Filled 2023-01-16: qty 1

## 2023-01-16 MED ORDER — OXYCODONE-ACETAMINOPHEN 5-325 MG PO TABS: 1.0000 | ORAL_TABLET | ORAL | 0 refills | Status: AC | PRN

## 2023-01-16 MED ORDER — IBUPROFEN 800 MG PO TABS: 800.0000 mg | ORAL_TABLET | Freq: Three times a day (TID) | ORAL | 0 refills | Status: AC | PRN

## 2023-01-16 MED ORDER — KETOROLAC TROMETHAMINE 60 MG/2ML IM SOLN
30.0000 mg | Freq: Once | INTRAMUSCULAR | Status: AC
  Administered 2023-01-16: 30 mg via INTRAMUSCULAR
  Filled 2023-01-16: qty 2

## 2023-01-16 NOTE — Discharge Instructions (Signed)
You may take pain medicines as needed.  Wear podiatric shoe and use crutches as needed for comfort.  You may bear weight as tolerated.  Return to the ER for worsening symptoms or other concerns.

## 2023-01-16 NOTE — ED Provider Notes (Signed)
[facility] Provider Note    Event Date/Time   First MD Initiated Contact with Patient 01/16/23 0121     (approximate)   History   Foot Pain   HPI  Kristina Lane is a 32 y.o. female brought to the ED via EMS from home with a chief complaint of nontraumatic left foot pain.  Patient awoke yesterday morning with pain and swelling to the top of her left foot.  No known injury.  Pain exacerbated on ambulation.  Denies insect bite.  Voices no other complaints or injuries.     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)    Migraines    PONV (postoperative nausea and vomiting)      Active Problem List   Patient Active Problem List   Diagnosis Date Noted   Right ovarian cyst 02/07/2022   Pelvic pain in female 02/19/2020   Left sided numbness 02/19/2020   Tinnitus of both ears 02/19/2020   Headache disorder 10/09/2019   Nerve pain 10/09/2019   Photophobia 10/09/2019   Weakness 10/09/2019   GERD (gastroesophageal reflux disease) 07/10/2019   Localized edema 07/10/2019   Mild intermittent asthma without complication 07/10/2019   Obesity, Class III, BMI 40-49.9 (morbid obesity) (HCC) 07/10/2019   Chronic pain of both wrists 04/14/2019   Chronic pain of lower extremity, bilateral 04/14/2019   Pain syndrome, chronic 04/14/2019     Past Surgical History   Past Surgical History:  Procedure Laterality Date   BREAST REDUCTION SURGERY Bilateral 10/28/2019   Procedure: BILATERAL MAMMARY REDUCTION  (BREAST);  Surgeon: [name], Chales Abrahams, MD;  Location: [facility];  Service: Plastics;  Laterality: Bilateral;   CESAREAN SECTION  2014   ESOPHAGOGASTRODUODENOSCOPY N/A 03/08/2020   Procedure: ESOPHAGOGASTRODUODENOSCOPY (EGD);  Surgeon: Regis Bill, MD;   Location: [facility] ENDOSCOPY;  Service: Endoscopy;  Laterality: N/A;   HERNIA REPAIR  2018   UMBILICAL   INSERTION OF MESH  2018   TO REPLACE INFECTED MESH   LAPAROSCOPIC VAGINAL HYSTERECTOMY WITH SALPINGECTOMY Bilateral 09/22/2019   Procedure: LAPAROSCOPIC ASSISTED VAGINAL HYSTERECTOMY WITH SALPINGECTOMY;  Surgeon: [name], Ihor Austin, MD;  Location: [facility] ORS;  Service: Gynecology;  Laterality: Bilateral;   LAPAROSCOPY N/A 05/14/2020   Procedure: LAPAROSCOPY DIAGNOSTIC;  Surgeon: Feliberto Gottron Ihor Austin, MD;  Location: [facility] ORS;  Service: Gynecology;  Laterality: N/A;   LYSIS OF ADHESION N/A 05/14/2020   Procedure: LYSIS OF ADHESION;  Surgeon: [name], Ihor Austin, MD;  Location: [facility] ORS;  Service: Gynecology;  Laterality: N/A;     Home Medications   Prior to Admission medications   Medication Sig Start Date End Date Taking? Authorizing Provider  azithromycin (ZITHROMAX) 500 MG tablet Take by mouth. 07/17/19   [provider]  dexlansoprazole (DEXILANT) 60 MG capsule Take by mouth. 08/19/20   [provider]  gabapentin (NEURONTIN) 300 MG capsule Take by mouth. 04/29/20   [provider]  HYDROcodone-acetaminophen (NORCO/VICODIN) 5-325 MG tablet Take by mouth. 04/29/20   [provider]  ibuprofen (ADVIL) 800 MG tablet Take by mouth. 04/29/20   [provider]  meclizine (ANTIVERT) 25 MG tablet Take by mouth.    [provider]  mirabegron ER (MYRBETRIQ) 25 MG TB24 tablet Take 1 tablet by mouth daily. 02/23/20  [provider]  Multiple Vitamins-Minerals (ONE-A-DAY VITACRAVES) CHEW Chew by mouth.    [provider]  nortriptyline (PAMELOR) 10 MG capsule Take 10 mg by mouth 2 (two) times daily. 02/14/20   [provider]  ondansetron (ZOFRAN) 8 MG tablet Take by mouth. 04/29/20   [provider]  pantoprazole (PROTONIX) 40 MG tablet Take 40 mg by mouth 2 (two) times daily before a meal.    [provider]  polyethylene glycol powder (GLYCOLAX/MIRALAX) 17 GM/SCOOP powder Mix in 4-8ounces of fluid prior to taking, take twice a day 03/18/20   [provider]  solifenacin (VESICARE) 5 MG tablet Take by mouth. 09/30/21   [provider]  tolterodine (DETROL LA) 2 MG 24 hr capsule Take by mouth. 03/18/20   [provider]     Allergies  Ciprofloxacin, Dilaudid [hydromorphone hcl], and Penicillins   Family History   Family History  Problem Relation Age of Onset   Breast cancer Paternal Aunt    Breast cancer Maternal Grandmother    Hypertension Mother    Kidney cancer Father    Hypertension Father      Physical Exam  Triage Vital Signs: ED Triage Vitals  Encounter Vitals Group     BP 01/15/23 2250 (!) 158/87     Systolic BP Percentile --      Diastolic BP Percentile --      Pulse Rate 01/15/23 2250 80     Resp 01/15/23 2250 19     Temp 01/15/23 2250 98.1 F (36.7 C)     Temp Source 01/16/23 0055 Oral     SpO2 01/15/23 2250 97 %     Weight 01/15/23 2249 217 lb (98.4 kg)     Height 01/15/23 2249 5' (1.524 m)     Head Circumference --      Peak Flow --      Pain Score 01/15/23 2248 10     Pain Loc --      Pain Education --      Exclude from Growth Chart --     Updated Vital Signs: BP (!) 164/90 (BP Location: Right Arm)   Pulse 90   Temp 98.1 F (36.7 C) (Oral)   Resp 17   Ht 5' (1.524 m)   Wt 98.4 kg   LMP 08/14/2019   SpO2 97%   BMI 42.38 kg/m    General: Awake, no distress.  CV:  Good peripheral perfusion.  Resp:  Normal effort.  Abd:  No distention.  Other:  Dorsal left foot with mild swelling.  Pain on applying pressure to sole of foot.  2+ distal pulses.  Brisk, less than 5-second capillary refill.   ED Results / Procedures / Treatments  Labs (all labs ordered are listed, but only abnormal results are displayed) Labs Reviewed - No data to display   EKG  None   RADIOLOGY I have independently visualized and  interpreted patient's imaging study as well as noted the radiology interpretation:  Left foot x-ray: Negative  Official radiology report(s): DG Foot Complete Left  Result Date: 01/16/2023 CLINICAL DATA:  Pain EXAM: LEFT FOOT - COMPLETE 3+ VIEW COMPARISON:  None Available. FINDINGS: There is no evidence of fracture or dislocation. There is no evidence of arthropathy or other focal bone abnormality. Soft tissues are unremarkable. IMPRESSION: Negative. Electronically Signed   By: Darliss Cheney M.D.   On: 01/16/2023 01:32     PROCEDURES:  Critical Care performed: No  Procedures  MEDICATIONS ORDERED IN ED: Medications  ketorolac (TORADOL) injection 30 mg (30 mg Intramuscular Given 01/16/23 0134)  oxyCODONE-acetaminophen (PERCOCET/ROXICET) 5-325 MG per tablet 1 tablet (1 tablet Oral Given 01/16/23 0136)     IMPRESSION / MDM / ASSESSMENT AND PLAN / ED COURSE  I reviewed the triage vital signs and the nursing notes.                             32 year old female presenting with nontraumatic left foot pain.  Administer NSAIDs, analgesia, placed in podiatric shoe, provide crutches and patient will follow-up with podiatry as needed.  Strict return precautions given.  Patient verbalizes understanding and agrees with plan of care.  Patient's presentation is most consistent with acute, uncomplicated illness.   FINAL CLINICAL IMPRESSION(S) / ED DIAGNOSES   Final diagnoses:  Foot pain, left     Rx / DC Orders   ED Discharge Orders     None        Note:  This document was prepared using Dragon voice recognition software and may include unintentional dictation errors.   Irean Hong, MD 01/16/23 [phone]

## 2023-01-22 DIAGNOSIS — R1010 Upper abdominal pain, unspecified: Secondary | ICD-10-CM | POA: Diagnosis not present

## 2023-01-22 DIAGNOSIS — R1013 Epigastric pain: Secondary | ICD-10-CM | POA: Diagnosis not present

## 2023-01-22 DIAGNOSIS — K5909 Other constipation: Secondary | ICD-10-CM | POA: Diagnosis not present

## 2023-02-12 DIAGNOSIS — R1010 Upper abdominal pain, unspecified: Secondary | ICD-10-CM | POA: Diagnosis not present

## 2023-02-12 DIAGNOSIS — R1013 Epigastric pain: Secondary | ICD-10-CM | POA: Diagnosis not present

## 2023-04-06 ENCOUNTER — Encounter: Payer: Self-pay | Admitting: *Deleted

## 2023-04-14 ENCOUNTER — Emergency Department: Payer: Medicaid Other

## 2023-04-14 DIAGNOSIS — R350 Frequency of micturition: Secondary | ICD-10-CM | POA: Diagnosis not present

## 2023-04-14 DIAGNOSIS — R112 Nausea with vomiting, unspecified: Secondary | ICD-10-CM | POA: Insufficient documentation

## 2023-04-14 DIAGNOSIS — Z5321 Procedure and treatment not carried out due to patient leaving prior to being seen by health care provider: Secondary | ICD-10-CM | POA: Diagnosis not present

## 2023-04-14 DIAGNOSIS — R319 Hematuria, unspecified: Secondary | ICD-10-CM | POA: Insufficient documentation

## 2023-04-14 DIAGNOSIS — R300 Dysuria: Secondary | ICD-10-CM | POA: Insufficient documentation

## 2023-04-14 DIAGNOSIS — R109 Unspecified abdominal pain: Secondary | ICD-10-CM | POA: Diagnosis not present

## 2023-04-14 DIAGNOSIS — K449 Diaphragmatic hernia without obstruction or gangrene: Secondary | ICD-10-CM | POA: Diagnosis not present

## 2023-04-14 DIAGNOSIS — R1084 Generalized abdominal pain: Secondary | ICD-10-CM | POA: Insufficient documentation

## 2023-04-14 LAB — COMPREHENSIVE METABOLIC PANEL
ALT: 19 U/L (ref 0–44)
AST: 17 U/L (ref 15–41)
Albumin: 4.1 g/dL (ref 3.5–5.0)
Alkaline Phosphatase: 63 U/L (ref 38–126)
Anion gap: 14 (ref 5–15)
BUN: 18 mg/dL (ref 6–20)
CO2: 24 mmol/L (ref 22–32)
Calcium: 8.7 mg/dL — ABNORMAL LOW (ref 8.9–10.3)
Chloride: 102 mmol/L (ref 98–111)
Creatinine, Ser: 0.77 mg/dL (ref 0.44–1.00)
GFR, Estimated: >60 mL/min (ref >60)
Glucose, Bld: 100 mg/dL — ABNORMAL HIGH (ref 70–99)
Potassium: 3.4 mmol/L — ABNORMAL LOW (ref 3.5–5.1)
Sodium: 140 mmol/L (ref 135–145)
Total Bilirubin: 0.5 mg/dL (ref 0.0–1.2)
Total Protein: 7.3 g/dL (ref 6.5–8.1)

## 2023-04-14 LAB — URINALYSIS, ROUTINE W REFLEX MICROSCOPIC
Bilirubin Urine: NEGATIVE
Glucose, UA: NEGATIVE mg/dL
Ketones, ur: NEGATIVE mg/dL
Nitrite: NEGATIVE
Protein, ur: 30 mg/dL — AB
Specific Gravity, Urine: 1.02 (ref 1.005–1.030)
WBC, UA: >50 WBC/hpf (ref 0–5)
pH: 8 (ref 5.0–8.0)

## 2023-04-14 LAB — CBC
HCT: 39.1 % (ref 36.0–46.0)
Hemoglobin: 13.2 g/dL (ref 12.0–15.0)
MCH: 28.9 pg (ref 26.0–34.0)
MCHC: 33.8 g/dL (ref 30.0–36.0)
MCV: 85.6 fL (ref 80.0–100.0)
Platelets: 287 10*3/uL (ref 150–400)
RBC: 4.57 MIL/uL (ref 3.87–5.11)
RDW: 13.1 % (ref 11.5–15.5)
WBC: 6.6 10*3/uL (ref 4.0–10.5)
nRBC: 0 % (ref 0.0–0.2)

## 2023-04-14 LAB — LIPASE, BLOOD: Lipase: 37 U/L (ref 11–51)

## 2023-04-14 LAB — POC URINE PREG, ED: Preg Test, Ur: NEGATIVE

## 2023-04-14 NOTE — ED Triage Notes (Signed)
 Pt states that she is having symptoms of UTI, dysuria and frequency and hematuria. Pt is also c/o of generalized abd pain with n/v, denies fevers.

## 2023-04-15 ENCOUNTER — Emergency Department
Admission: EM | Admit: 2023-04-15 | Discharge: 2023-04-15 | Discharge status: Against Medical Advice | Payer: Medicaid Other | Attending: Emergency Medicine | Admitting: Emergency Medicine

## 2023-04-24 ENCOUNTER — Encounter
Admission: RE | Disposition: A | Discharge status: Home / Self Care | Payer: Self-pay | Source: Home / Self Care | Attending: Gastroenterology

## 2023-04-24 ENCOUNTER — Ambulatory Visit: Payer: Medicaid Other | Admitting: Anesthesiology

## 2023-04-24 ENCOUNTER — Ambulatory Visit
Admission: RE | Admit: 2023-04-24 | Discharge: 2023-04-24 | Disposition: A | Discharge status: Home / Self Care | Payer: Medicaid Other | Attending: Gastroenterology | Admitting: Gastroenterology

## 2023-04-24 DIAGNOSIS — J45909 Unspecified asthma, uncomplicated: Secondary | ICD-10-CM | POA: Diagnosis not present

## 2023-04-24 DIAGNOSIS — K449 Diaphragmatic hernia without obstruction or gangrene: Secondary | ICD-10-CM | POA: Insufficient documentation

## 2023-04-24 DIAGNOSIS — Z6841 Body Mass Index (BMI) 40.0 and over, adult: Secondary | ICD-10-CM | POA: Diagnosis not present

## 2023-04-24 DIAGNOSIS — R1013 Epigastric pain: Secondary | ICD-10-CM | POA: Diagnosis not present

## 2023-04-24 DIAGNOSIS — K259 Gastric ulcer, unspecified as acute or chronic, without hemorrhage or perforation: Secondary | ICD-10-CM | POA: Diagnosis not present

## 2023-04-24 DIAGNOSIS — E66813 Obesity, class 3: Secondary | ICD-10-CM | POA: Diagnosis not present

## 2023-04-24 DIAGNOSIS — R1010 Upper abdominal pain, unspecified: Secondary | ICD-10-CM | POA: Insufficient documentation

## 2023-04-24 DIAGNOSIS — K219 Gastro-esophageal reflux disease without esophagitis: Secondary | ICD-10-CM | POA: Insufficient documentation

## 2023-04-24 HISTORY — DX: Unspecified ovarian cyst, right side: N83.201

## 2023-04-24 HISTORY — DX: Visual discomfort, unspecified: H53.149

## 2023-04-24 HISTORY — PX: ESOPHAGOGASTRODUODENOSCOPY (EGD) WITH PROPOFOL: SHX5813

## 2023-04-24 HISTORY — DX: Tinnitus, bilateral: H93.13

## 2023-04-24 SURGERY — ESOPHAGOGASTRODUODENOSCOPY (EGD) WITH PROPOFOL
Anesthesia: General

## 2023-04-24 MED ORDER — DEXMEDETOMIDINE HCL IN NACL 80 MCG/20ML IV SOLN
INTRAVENOUS | Status: DC | PRN
  Administered 2023-04-24: 20 ug via INTRAVENOUS

## 2023-04-24 MED ORDER — SODIUM CHLORIDE 0.9 % IV SOLN: INTRAVENOUS | Status: DC

## 2023-04-24 MED ORDER — LIDOCAINE HCL (PF) 2 % IJ SOLN
INTRAMUSCULAR | Status: AC
  Filled 2023-04-24: qty 5

## 2023-04-24 MED ORDER — PROPOFOL 1000 MG/100ML IV EMUL
INTRAVENOUS | Status: AC
  Filled 2023-04-24: qty 100

## 2023-04-24 MED ORDER — GLYCOPYRROLATE 0.2 MG/ML IJ SOLN
INTRAMUSCULAR | Status: AC
  Filled 2023-04-24: qty 1

## 2023-04-24 MED ORDER — LIDOCAINE HCL (CARDIAC) PF 100 MG/5ML IV SOSY
PREFILLED_SYRINGE | INTRAVENOUS | Status: DC | PRN
  Administered 2023-04-24: 100 mg via INTRAVENOUS

## 2023-04-24 MED ORDER — PROPOFOL 10 MG/ML IV BOLUS
INTRAVENOUS | Status: DC | PRN
  Administered 2023-04-24 (×2): 50 mg via INTRAVENOUS

## 2023-04-24 MED ORDER — PROPOFOL 500 MG/50ML IV EMUL
INTRAVENOUS | Status: DC | PRN
  Administered 2023-04-24: 125 ug/kg/min via INTRAVENOUS

## 2023-04-24 NOTE — Anesthesia Postprocedure Evaluation (Signed)
Anesthesia Post Note  Patient: Anastasya S Fenn  Procedure(s) Performed: ESOPHAGOGASTRODUODENOSCOPY (EGD) WITH PROPOFOL  Patient location during evaluation: PACU Anesthesia Type: General Level of consciousness: awake and alert, oriented and patient cooperative Pain management: pain level controlled Vital Signs Assessment: post-procedure vital signs reviewed and stable Respiratory status: spontaneous breathing, nonlabored ventilation and respiratory function stable Cardiovascular status: blood pressure returned to baseline and stable Postop Assessment: adequate PO intake Anesthetic complications: no   No notable events documented.   Last Vitals:  Vitals:   04/24/23 1347 04/24/23 1401  BP: (!) 123/90 132/85  Pulse: (!) 108 82  Resp: 20 18  Temp:    SpO2: 99% 98%    Last Pain:  Vitals:   04/24/23 1401  TempSrc:   PainSc: Asleep                 Reed Breech

## 2023-04-24 NOTE — Transfer of Care (Signed)
Immediate Anesthesia Transfer of Care Note  Patient: Kristina Lane  Procedure(s) Performed: ESOPHAGOGASTRODUODENOSCOPY (EGD) WITH PROPOFOL  Patient Location: PACU  Anesthesia Type:General  Level of Consciousness: sedated  Airway & Oxygen Therapy: Patient Spontanous Breathing and Patient connected to nasal cannula oxygen  Post-op Assessment: Report given to RN and Post -op Vital signs reviewed and stable  Post vital signs: Reviewed and stable  Last Vitals:  Vitals Value Taken Time  BP 103/68 04/24/23 1338  Temp 36.3 C 04/24/23 1336  Pulse 96 04/24/23 1339  Resp 18 04/24/23 1339  SpO2 100 % 04/24/23 1339  Vitals shown include unfiled device data.  Last Pain:  Vitals:   04/24/23 1336  TempSrc: Temporal  PainSc: Asleep         Complications: No notable events documented.

## 2023-04-24 NOTE — H&P (Signed)
Outpatient short stay form Pre-procedure 04/24/2023  Regis Bill, MD  Primary Physician: Mick Sell, MD  Reason for visit:  Nausea/Vomiting  History of present illness:    33 y/o lady with known large hiatal hernia here for EGD for nausea/vomiting/gerd. No blood thinners. No family history of GI malignancies. History of ventral hernia repair.    Current Facility-Administered Medications:    0.9 %  sodium chloride infusion, , Intravenous, Continuous, Rhyann Berton, Rossie Muskrat, MD, Last Rate: 20 mL/hr at 04/24/23 1317, Continued from Pre-op at 04/24/23 1317  Medications Prior to Admission  Medication Sig Dispense Refill Last Dose/Taking   azithromycin (ZITHROMAX) 500 MG tablet Take by mouth.      dexlansoprazole (DEXILANT) 60 MG capsule Take by mouth.      gabapentin (NEURONTIN) 300 MG capsule Take by mouth.      HYDROcodone-acetaminophen (NORCO/VICODIN) 5-325 MG tablet Take by mouth.      ibuprofen (ADVIL) 800 MG tablet Take 1 tablet (800 mg total) by mouth every 8 (eight) hours as needed for moderate pain (pain score 4-6). 15 tablet 0    meclizine (ANTIVERT) 25 MG tablet Take by mouth.      mirabegron ER (MYRBETRIQ) 25 MG TB24 tablet Take 1 tablet by mouth daily.      Multiple Vitamins-Minerals (ONE-A-DAY VITACRAVES) CHEW Chew by mouth.      nortriptyline (PAMELOR) 10 MG capsule Take 10 mg by mouth 2 (two) times daily.      ondansetron (ZOFRAN) 8 MG tablet Take by mouth.      oxyCODONE-acetaminophen (PERCOCET/ROXICET) 5-325 MG tablet Take 1 tablet by mouth every 4 (four) hours as needed for severe pain (pain score 7-10). 10 tablet 0    pantoprazole (PROTONIX) 40 MG tablet Take 40 mg by mouth 2 (two) times daily before a meal.      polyethylene glycol powder (GLYCOLAX/MIRALAX) 17 GM/SCOOP powder Mix in 4-8ounces of fluid prior to taking, take twice a day      solifenacin (VESICARE) 5 MG tablet Take by mouth.      tolterodine (DETROL LA) 2 MG 24 hr capsule Take by mouth.         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    Physical Exam  Gen: Alert, oriented. Appears stated age.  HEENT: PERRLA. Lungs: No respiratory distress CV: RRR Abd: soft, benign, no masses Ext: No edema    Planned procedures: Proceed with EGD. The patient understands the nature of the planned procedure, indications, risks, alternatives and potential complications including but not limited to bleeding, infection, perforation, damage to internal organs and possible oversedation/side effects from anesthesia. The patient agrees and gives consent to proceed.  Please refer to procedure notes for findings, recommendations and patient disposition/instructions.     Regis Bill, MD [facility] Gastroenterology

## 2023-04-24 NOTE — Interval H&P Note (Signed)
History and Physical Interval Note:  04/24/2023 1:20 PM  Kristina Lane  has presented today for surgery, with the diagnosis of UPPER ABDOMINAL PAIN, DYSPEPSIA.  The various methods of treatment have been discussed with the patient and family. After consideration of risks, benefits and other options for treatment, the patient has consented to  Procedure(s): ESOPHAGOGASTRODUODENOSCOPY (EGD) WITH PROPOFOL (N/A) as a surgical intervention.  The patient's history has been reviewed, patient examined, no change in status, stable for surgery.  I have reviewed the patient's chart and labs.  Questions were answered to the patient's satisfaction.     Regis Bill  Ok to proceed with EGD

## 2023-04-24 NOTE — Anesthesia Preprocedure Evaluation (Signed)
Anesthesia Evaluation  Patient identified by MRN, date of birth, ID band Patient awake    Reviewed: Allergy & Precautions, NPO status , Patient's Chart, lab work & pertinent test results  History of Anesthesia Complications (+) PONV and history of anesthetic complications  Airway Mallampati: II  TM Distance: >3 FB Neck ROM: full    Dental no notable dental hx.    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) No date: Migraines No date: Photophobia No date: PONV (postoperative nausea and vomiting) No date: Right ovarian cyst No date: Tinnitus of both ears  Past Surgical History: 10/28/2019: BREAST REDUCTION SURGERY; Bilateral     Comment:  Procedure: BILATERAL MAMMARY REDUCTION  (BREAST);                Surgeon: [name], Chales Abrahams, MD;  Location: [facility]               [place];  Service: Plastics;  Laterality:               Bilateral; 2014: CESAREAN SECTION No date: COLONOSCOPY 03/08/2020: ESOPHAGOGASTRODUODENOSCOPY; N/A     Comment:  Procedure: ESOPHAGOGASTRODUODENOSCOPY (EGD);  Surgeon:               Regis Bill, MD;  Location: [facility] ENDOSCOPY;                Service: Endoscopy;  Laterality: N/A; 2018: HERNIA REPAIR     Comment:  UMBILICAL 2018: INSERTION OF MESH     Comment:  TO REPLACE INFECTED  MESH 09/22/2019: LAPAROSCOPIC VAGINAL HYSTERECTOMY WITH SALPINGECTOMY;  Bilateral     Comment:  Procedure: LAPAROSCOPIC ASSISTED VAGINAL HYSTERECTOMY               WITH SALPINGECTOMY;  Surgeon: [name], Ihor Austin, MD;              Location: [facility] ORS;  Service: Gynecology;  Laterality:               Bilateral; 05/14/2020: LAPAROSCOPY; N/A     Comment:  Procedure: LAPAROSCOPY DIAGNOSTIC;  Surgeon:               Suzy Bouchard, MD;  Location: [facility] ORS;                Service: Gynecology;  Laterality: N/A; 05/14/2020: LYSIS OF ADHESION; N/A     Comment:  Procedure: LYSIS OF ADHESION;  Surgeon: Suzy Bouchard, MD;  Location: [facility] ORS;  Service: Gynecology;               Laterality: N/A;  BMI    Body Mass Index: 44.72 kg/m      Reproductive/Obstetrics negative OB ROS  Anesthesia Physical Anesthesia Plan  ASA: 3  Anesthesia Plan: General   Post-op Pain Management: Minimal or no pain anticipated   Induction: Intravenous  PONV Risk Score and Plan: 3 and Propofol infusion and TIVA  Airway Management Planned: Natural Airway and Nasal Cannula  Additional Equipment:   Intra-op Plan:   Post-operative Plan:   Informed Consent: I have reviewed the patients History and Physical, chart, labs and discussed the procedure including the risks, benefits and alternatives for the proposed anesthesia with the patient or authorized representative who has indicated his/her understanding and acceptance.     Dental Advisory Given  Plan Discussed with: Anesthesiologist, CRNA and Surgeon  Anesthesia Plan Comments: (Patient consented for risks of anesthesia including but not limited to:  - adverse reactions to medications - risk of airway placement if required - damage to eyes, teeth, lips or other oral mucosa - nerve damage due to positioning  - sore throat or hoarseness - Damage to heart, brain, nerves,  lungs, other parts of body or loss of life  Patient voiced understanding and assent.)       Anesthesia Quick Evaluation

## 2023-04-24 NOTE — Op Note (Addendum)
[facility] Gastroenterology Patient Name: Kristina Lane Procedure Date: 04/24/2023 1:18 PM MRN: [id number] Account #: [id number] Date of Birth: [birth year] Admit Type: Outpatient Age: 33 Room: [facility] ENDO ROOM 1 Gender: Female Note Status: Supervisor Override Instrument Name: Patton Salles Endoscope [id number] Procedure:             Upper GI endoscopy Indications:           Upper abdominal pain, Dyspepsia Providers:             Eather Colas MD, MD Referring MD:          Clydie Braun (Referring MD) Medicines:             Monitored Anesthesia Care Complications:         No immediate complications. Procedure:             Pre-Anesthesia Assessment:                        - Prior to the procedure, a History and Physical was                         performed, and patient medications and allergies were                         reviewed. The patient is competent. The risks and                         benefits of the procedure and the sedation options and                         risks were discussed with the patient. All questions                         were answered and informed consent was obtained.                         Patient identification and proposed procedure were                         verified by the physician, the nurse, the                         anesthesiologist, the anesthetist and the technician                         in the endoscopy suite. Mental Status Examination:                         alert and oriented. Airway Examination: normal                         oropharyngeal airway and neck mobility. Respiratory                         Examination: clear to auscultation. CV Examination:                         normal. Prophylactic Antibiotics: The patient does not  require prophylactic antibiotics. Prior                         Anticoagulants: The patient has taken no anticoagulant                         or antiplatelet agents.  ASA Grade Assessment: III - A                         patient with severe systemic disease. After reviewing                         the risks and benefits, the patient was deemed in                         satisfactory condition to undergo the procedure. The                         anesthesia plan was to use monitored anesthesia care                         (MAC). Immediately prior to administration of                         medications, the patient was re-assessed for adequacy                         to receive sedatives. The heart rate, respiratory                         rate, oxygen saturations, blood pressure, adequacy of                         pulmonary ventilation, and response to care were                         monitored throughout the procedure. The physical                         status of the patient was re-assessed after the                         procedure.                        After obtaining informed consent, the endoscope was                         passed under direct vision. Throughout the procedure,                         the patient's blood pressure, pulse, and oxygen                         saturations were monitored continuously. The Endoscope                         was introduced through the mouth, and advanced to the  second part of duodenum. The upper GI endoscopy was                         accomplished without difficulty. The patient tolerated                         the procedure well. Findings:      A 5 cm hiatal hernia with a single Yatzari Jonsson ulcer was found.      The exam of the esophagus was otherwise normal.      The entire examined stomach was normal.      The examined duodenum was normal. Impression:            - 5 cm hiatal hernia with a single Kaytie Ratcliffe ulcer.                        - Normal stomach.                        - Normal examined duodenum.                        - No specimens collected. Recommendation:         - Discharge patient to home.                        - Resume previous diet.                        - Continue present medications.                        - Return to referring physician as previously                         scheduled. Procedure Code(s):     --- Professional ---                        [phone], Esophagogastroduodenoscopy, flexible,                         transoral; diagnostic, including collection of                         specimen(s) by brushing or washing, when performed                         (separate procedure) Diagnosis Code(s):     --- Professional ---                        K44.9, Diaphragmatic hernia without obstruction or                         gangrene                        K25.9, Gastric ulcer, unspecified as acute or chronic,                         without hemorrhage or perforation  R11.2, Nausea with vomiting, unspecified CPT copyright 2022 [employer]. All rights reserved. The codes documented in this report are preliminary and upon coder review may  be revised to meet current compliance requirements. Eather Colas MD, MD 04/24/2023 1:36:46 PM Number of Addenda: 0 Note Initiated On: 04/24/2023 1:18 PM Estimated Blood Loss:  Estimated blood loss: none.      [facility]

## 2023-04-25 ENCOUNTER — Encounter: Payer: Self-pay | Admitting: Gastroenterology

## 2023-05-01 DIAGNOSIS — R1084 Generalized abdominal pain: Secondary | ICD-10-CM | POA: Diagnosis not present

## 2023-05-01 DIAGNOSIS — K449 Diaphragmatic hernia without obstruction or gangrene: Secondary | ICD-10-CM | POA: Diagnosis not present

## 2023-05-09 ENCOUNTER — Other Ambulatory Visit: Payer: Self-pay | Admitting: Gastroenterology

## 2023-05-09 DIAGNOSIS — K449 Diaphragmatic hernia without obstruction or gangrene: Secondary | ICD-10-CM

## 2023-05-09 DIAGNOSIS — R1084 Generalized abdominal pain: Secondary | ICD-10-CM

## 2023-05-21 ENCOUNTER — Ambulatory Visit
Admission: RE | Admit: 2023-05-21 | Discharge: 2023-05-21 | Disposition: A | Discharge status: Home / Self Care | Payer: Medicaid Other | Source: Ambulatory Visit | Attending: Gastroenterology | Admitting: Gastroenterology

## 2023-05-21 DIAGNOSIS — R1084 Generalized abdominal pain: Secondary | ICD-10-CM

## 2023-05-21 DIAGNOSIS — K449 Diaphragmatic hernia without obstruction or gangrene: Secondary | ICD-10-CM | POA: Diagnosis not present

## 2023-05-21 DIAGNOSIS — R1110 Vomiting, unspecified: Secondary | ICD-10-CM | POA: Diagnosis not present

## 2023-05-21 DIAGNOSIS — K5909 Other constipation: Secondary | ICD-10-CM | POA: Diagnosis not present

## 2023-05-21 DIAGNOSIS — K219 Gastro-esophageal reflux disease without esophagitis: Secondary | ICD-10-CM | POA: Diagnosis not present

## 2023-07-25 DIAGNOSIS — M79662 Pain in left lower leg: Secondary | ICD-10-CM | POA: Diagnosis not present

## 2023-07-25 DIAGNOSIS — S86812A Strain of other muscle(s) and tendon(s) at lower leg level, left leg, initial encounter: Secondary | ICD-10-CM | POA: Diagnosis not present

## 2023-10-05 DIAGNOSIS — J069 Acute upper respiratory infection, unspecified: Secondary | ICD-10-CM | POA: Diagnosis not present

## 2023-10-05 DIAGNOSIS — J45901 Unspecified asthma with (acute) exacerbation: Secondary | ICD-10-CM | POA: Diagnosis not present

## 2023-10-05 DIAGNOSIS — R079 Chest pain, unspecified: Secondary | ICD-10-CM | POA: Diagnosis not present

## 2023-10-05 DIAGNOSIS — Z86718 Personal history of other venous thrombosis and embolism: Secondary | ICD-10-CM | POA: Diagnosis not present

## 2023-10-05 DIAGNOSIS — R0789 Other chest pain: Secondary | ICD-10-CM | POA: Diagnosis not present

## 2023-10-05 DIAGNOSIS — K219 Gastro-esophageal reflux disease without esophagitis: Secondary | ICD-10-CM | POA: Diagnosis not present

## 2023-12-22 ENCOUNTER — Ambulatory Visit: Payer: Self-pay

## 2024-02-21 DIAGNOSIS — S6702XA Crushing injury of left thumb, initial encounter: Secondary | ICD-10-CM | POA: Diagnosis not present

## 2024-02-21 DIAGNOSIS — M7989 Other specified soft tissue disorders: Secondary | ICD-10-CM | POA: Diagnosis not present

## 2024-03-18 DIAGNOSIS — S60112A Contusion of left thumb with damage to nail, initial encounter: Secondary | ICD-10-CM | POA: Diagnosis not present

## 2024-05-20 ENCOUNTER — Ambulatory Visit: Admission: RE | Admit: 2024-05-20 | Source: Home / Self Care

## 2024-05-20 ENCOUNTER — Encounter: Admission: RE | Payer: Self-pay | Source: Home / Self Care
